# Patient Record
Sex: Female | Born: 1940 | Race: White | Hispanic: No | Marital: Married | Smoking: Never smoker
Health system: Southern US, Community
[De-identification: ages and names within clinical notes are randomized; demographics above are authoritative.]

## PROBLEM LIST (undated history)

## (undated) DIAGNOSIS — M810 Age-related osteoporosis without current pathological fracture: Secondary | ICD-10-CM

## (undated) DIAGNOSIS — J449 Chronic obstructive pulmonary disease, unspecified: Secondary | ICD-10-CM

## (undated) DIAGNOSIS — R0600 Dyspnea, unspecified: Secondary | ICD-10-CM

## (undated) DIAGNOSIS — K760 Fatty (change of) liver, not elsewhere classified: Secondary | ICD-10-CM

## (undated) DIAGNOSIS — K219 Gastro-esophageal reflux disease without esophagitis: Secondary | ICD-10-CM

## (undated) DIAGNOSIS — M1990 Unspecified osteoarthritis, unspecified site: Secondary | ICD-10-CM

## (undated) DIAGNOSIS — I10 Essential (primary) hypertension: Secondary | ICD-10-CM

## (undated) DIAGNOSIS — R42 Dizziness and giddiness: Secondary | ICD-10-CM

## (undated) DIAGNOSIS — M419 Scoliosis, unspecified: Secondary | ICD-10-CM

## (undated) DIAGNOSIS — J45909 Unspecified asthma, uncomplicated: Secondary | ICD-10-CM

## (undated) DIAGNOSIS — M6530 Trigger finger, unspecified finger: Secondary | ICD-10-CM

## (undated) DIAGNOSIS — K222 Esophageal obstruction: Secondary | ICD-10-CM

## (undated) DIAGNOSIS — E559 Vitamin D deficiency, unspecified: Secondary | ICD-10-CM

## (undated) DIAGNOSIS — E669 Obesity, unspecified: Secondary | ICD-10-CM

## (undated) DIAGNOSIS — E785 Hyperlipidemia, unspecified: Secondary | ICD-10-CM

## (undated) HISTORY — PX: UMBILICAL HERNIA REPAIR: SHX196

## (undated) HISTORY — PX: COLON SURGERY: SHX602

## (undated) HISTORY — PX: TUBAL LIGATION: SHX77

## (undated) HISTORY — PX: APPENDECTOMY: SHX54

## (undated) HISTORY — PX: COLONOSCOPY: SHX5424

## (undated) HISTORY — PX: BREAST CYST ASPIRATION: SHX578

## (undated) HISTORY — PX: OTHER SURGICAL HISTORY: SHX169

## (undated) HISTORY — PX: HERNIA REPAIR: SHX51

---

## 2005-01-31 ENCOUNTER — Ambulatory Visit: Payer: Self-pay | Admitting: Internal Medicine

## 2005-07-04 ENCOUNTER — Ambulatory Visit: Payer: Self-pay | Admitting: Gastroenterology

## 2006-02-06 ENCOUNTER — Ambulatory Visit: Payer: Self-pay | Admitting: Internal Medicine

## 2007-02-12 ENCOUNTER — Ambulatory Visit: Payer: Self-pay | Admitting: Internal Medicine

## 2007-07-27 ENCOUNTER — Ambulatory Visit: Payer: Self-pay | Admitting: Gastroenterology

## 2008-02-14 ENCOUNTER — Ambulatory Visit: Payer: Self-pay | Admitting: Internal Medicine

## 2009-02-09 ENCOUNTER — Ambulatory Visit: Payer: Self-pay | Admitting: Unknown Physician Specialty

## 2009-02-16 ENCOUNTER — Ambulatory Visit: Payer: Self-pay | Admitting: Internal Medicine

## 2010-02-18 ENCOUNTER — Ambulatory Visit: Payer: Self-pay | Admitting: Internal Medicine

## 2010-12-20 ENCOUNTER — Ambulatory Visit: Payer: Self-pay | Admitting: Surgery

## 2010-12-24 ENCOUNTER — Ambulatory Visit: Payer: Self-pay | Admitting: Surgery

## 2011-02-21 ENCOUNTER — Ambulatory Visit: Payer: Self-pay | Admitting: Internal Medicine

## 2012-02-22 ENCOUNTER — Ambulatory Visit: Payer: Self-pay | Admitting: Internal Medicine

## 2012-05-23 ENCOUNTER — Ambulatory Visit: Payer: Self-pay | Admitting: Rheumatology

## 2012-05-31 ENCOUNTER — Ambulatory Visit: Payer: Self-pay | Admitting: Rheumatology

## 2013-02-22 ENCOUNTER — Ambulatory Visit: Payer: Self-pay

## 2014-02-24 ENCOUNTER — Ambulatory Visit: Payer: Self-pay

## 2015-02-09 ENCOUNTER — Other Ambulatory Visit: Payer: Self-pay | Admitting: Internal Medicine

## 2015-02-09 DIAGNOSIS — Z1231 Encounter for screening mammogram for malignant neoplasm of breast: Secondary | ICD-10-CM

## 2015-02-26 ENCOUNTER — Ambulatory Visit
Admission: RE | Admit: 2015-02-26 | Discharge: 2015-02-26 | Disposition: A | Discharge status: Home / Self Care | Payer: Medicare Other | Source: Ambulatory Visit | Attending: Internal Medicine | Admitting: Internal Medicine

## 2015-02-26 DIAGNOSIS — Z1231 Encounter for screening mammogram for malignant neoplasm of breast: Secondary | ICD-10-CM | POA: Insufficient documentation

## 2015-07-29 ENCOUNTER — Other Ambulatory Visit: Payer: Self-pay | Admitting: Internal Medicine

## 2015-07-29 DIAGNOSIS — R0989 Other specified symptoms and signs involving the circulatory and respiratory systems: Secondary | ICD-10-CM

## 2015-07-29 DIAGNOSIS — Z1231 Encounter for screening mammogram for malignant neoplasm of breast: Secondary | ICD-10-CM

## 2015-07-30 ENCOUNTER — Encounter: Payer: Self-pay | Admitting: *Deleted

## 2015-07-31 ENCOUNTER — Encounter: Payer: Self-pay | Admitting: *Deleted

## 2015-07-31 ENCOUNTER — Ambulatory Visit
Admission: RE | Admit: 2015-07-31 | Discharge: 2015-07-31 | Disposition: A | Discharge status: Home / Self Care | Payer: Medicare Other | Source: Ambulatory Visit | Attending: Unknown Physician Specialty | Admitting: Unknown Physician Specialty

## 2015-07-31 ENCOUNTER — Ambulatory Visit: Payer: Medicare Other | Admitting: Certified Registered Nurse Anesthetist

## 2015-07-31 ENCOUNTER — Encounter
Admission: RE | Disposition: A | Discharge status: Home / Self Care | Payer: Self-pay | Source: Ambulatory Visit | Attending: Unknown Physician Specialty

## 2015-07-31 DIAGNOSIS — K219 Gastro-esophageal reflux disease without esophagitis: Secondary | ICD-10-CM | POA: Insufficient documentation

## 2015-07-31 DIAGNOSIS — M419 Scoliosis, unspecified: Secondary | ICD-10-CM | POA: Insufficient documentation

## 2015-07-31 DIAGNOSIS — Z79899 Other long term (current) drug therapy: Secondary | ICD-10-CM | POA: Insufficient documentation

## 2015-07-31 DIAGNOSIS — M810 Age-related osteoporosis without current pathological fracture: Secondary | ICD-10-CM | POA: Insufficient documentation

## 2015-07-31 DIAGNOSIS — D122 Benign neoplasm of ascending colon: Secondary | ICD-10-CM | POA: Insufficient documentation

## 2015-07-31 DIAGNOSIS — Z6830 Body mass index (BMI) 30.0-30.9, adult: Secondary | ICD-10-CM | POA: Insufficient documentation

## 2015-07-31 DIAGNOSIS — D124 Benign neoplasm of descending colon: Secondary | ICD-10-CM | POA: Diagnosis not present

## 2015-07-31 DIAGNOSIS — K760 Fatty (change of) liver, not elsewhere classified: Secondary | ICD-10-CM | POA: Diagnosis not present

## 2015-07-31 DIAGNOSIS — I10 Essential (primary) hypertension: Secondary | ICD-10-CM | POA: Insufficient documentation

## 2015-07-31 DIAGNOSIS — Z86010 Personal history of colonic polyps: Secondary | ICD-10-CM | POA: Insufficient documentation

## 2015-07-31 DIAGNOSIS — Z7951 Long term (current) use of inhaled steroids: Secondary | ICD-10-CM | POA: Insufficient documentation

## 2015-07-31 DIAGNOSIS — E785 Hyperlipidemia, unspecified: Secondary | ICD-10-CM | POA: Insufficient documentation

## 2015-07-31 DIAGNOSIS — D125 Benign neoplasm of sigmoid colon: Secondary | ICD-10-CM | POA: Diagnosis not present

## 2015-07-31 DIAGNOSIS — J45909 Unspecified asthma, uncomplicated: Secondary | ICD-10-CM | POA: Diagnosis not present

## 2015-07-31 DIAGNOSIS — K621 Rectal polyp: Secondary | ICD-10-CM | POA: Diagnosis not present

## 2015-07-31 DIAGNOSIS — Z7982 Long term (current) use of aspirin: Secondary | ICD-10-CM | POA: Diagnosis not present

## 2015-07-31 DIAGNOSIS — D120 Benign neoplasm of cecum: Secondary | ICD-10-CM | POA: Insufficient documentation

## 2015-07-31 DIAGNOSIS — E669 Obesity, unspecified: Secondary | ICD-10-CM | POA: Diagnosis not present

## 2015-07-31 DIAGNOSIS — Z803 Family history of malignant neoplasm of breast: Secondary | ICD-10-CM | POA: Insufficient documentation

## 2015-07-31 HISTORY — DX: Unspecified asthma, uncomplicated: J45.909

## 2015-07-31 HISTORY — DX: Obesity, unspecified: E66.9

## 2015-07-31 HISTORY — DX: Esophageal obstruction: K22.2

## 2015-07-31 HISTORY — DX: Essential (primary) hypertension: I10

## 2015-07-31 HISTORY — DX: Gastro-esophageal reflux disease without esophagitis: K21.9

## 2015-07-31 HISTORY — DX: Hyperlipidemia, unspecified: E78.5

## 2015-07-31 HISTORY — PX: COLONOSCOPY WITH PROPOFOL: SHX5780

## 2015-07-31 HISTORY — DX: Age-related osteoporosis without current pathological fracture: M81.0

## 2015-07-31 HISTORY — DX: Fatty (change of) liver, not elsewhere classified: K76.0

## 2015-07-31 HISTORY — DX: Scoliosis, unspecified: M41.9

## 2015-07-31 SURGERY — COLONOSCOPY WITH PROPOFOL
Anesthesia: General

## 2015-07-31 MED ORDER — PHENYLEPHRINE HCL 10 MG/ML IJ SOLN
INTRAMUSCULAR | Status: DC | PRN
  Administered 2015-07-31: 100 ug via INTRAVENOUS

## 2015-07-31 MED ORDER — LIDOCAINE HCL (CARDIAC) 20 MG/ML IV SOLN
INTRAVENOUS | Status: DC | PRN
  Administered 2015-07-31: 80 mg via INTRAVENOUS

## 2015-07-31 MED ORDER — MIDAZOLAM HCL 2 MG/2ML IJ SOLN
INTRAMUSCULAR | Status: DC | PRN
  Administered 2015-07-31: 1 mg via INTRAVENOUS

## 2015-07-31 MED ORDER — SODIUM CHLORIDE 0.9 % IV SOLN: INTRAVENOUS | Status: DC

## 2015-07-31 MED ORDER — PROPOFOL 500 MG/50ML IV EMUL
INTRAVENOUS | Status: DC | PRN
  Administered 2015-07-31: 120 ug/kg/min via INTRAVENOUS

## 2015-07-31 MED ORDER — PROPOFOL 10 MG/ML IV BOLUS
INTRAVENOUS | Status: DC | PRN
  Administered 2015-07-31: 20 mg via INTRAVENOUS
  Administered 2015-07-31: 10 mg via INTRAVENOUS
  Administered 2015-07-31: 20 mg via INTRAVENOUS
  Administered 2015-07-31: 10 mg via INTRAVENOUS

## 2015-07-31 MED ORDER — SODIUM CHLORIDE 0.9 % IV SOLN
INTRAVENOUS | Status: DC
  Administered 2015-07-31: 10:00:00 via INTRAVENOUS

## 2015-07-31 NOTE — Anesthesia Preprocedure Evaluation (Signed)
Anesthesia Evaluation  Patient identified by MRN, date of birth, ID band Patient awake    Reviewed: Allergy & Precautions, H&P , NPO status , Patient's Chart, lab work & pertinent test results  History of Anesthesia Complications Negative for: history of anesthetic complications  Airway Mallampati: III  TM Distance: >3 FB Neck ROM: limited    Dental  (+) Poor Dentition, Chipped   Pulmonary neg shortness of breath, asthma ,    Pulmonary exam normal breath sounds clear to auscultation       Cardiovascular Exercise Tolerance: Good hypertension, (-) angina(-) Past MI and (-) DOE Normal cardiovascular exam Rhythm:regular Rate:Normal     Neuro/Psych negative neurological ROS  negative psych ROS   GI/Hepatic Neg liver ROS, GERD  Controlled,  Endo/Other  negative endocrine ROS  Renal/GU negative Renal ROS  negative genitourinary   Musculoskeletal   Abdominal   Peds  Hematology negative hematology ROS (+)   Anesthesia Other Findings Past Medical History:   Asthma                                                       Obesity                                                      Lipids serum increased                                       Scoliosis                                                    Osteoporosis                                                 Fatty liver                                                  Esophageal stricture                                         GERD (gastroesophageal reflux disease)                       Hypertension                                                Past Surgical History:   COLON SURGERY  ADENTAMOUS POLYP                                              ESOPHAGEAL DILITATION                                         UMBILICAL HERNIA REPAIR                                       HERNIA REPAIR                                                  rt ELBOW SURGERY                                              COLONOSCOPY                                                  BMI    Body Mass Index   30.35 kg/m 2      Reproductive/Obstetrics negative OB ROS                             Anesthesia Physical Anesthesia Plan  ASA: III  Anesthesia Plan: General   Post-op Pain Management:    Induction:   Airway Management Planned:   Additional Equipment:   Intra-op Plan:   Post-operative Plan:   Informed Consent: I have reviewed the patients History and Physical, chart, labs and discussed the procedure including the risks, benefits and alternatives for the proposed anesthesia with the patient or authorized representative who has indicated his/her understanding and acceptance.   Dental Advisory Given  Plan Discussed with: Anesthesiologist, CRNA and Surgeon  Anesthesia Plan Comments:         Anesthesia Quick Evaluation

## 2015-07-31 NOTE — Anesthesia Postprocedure Evaluation (Signed)
Anesthesia Post Note  Patient: Janet Rios  Procedure(s) Performed: Procedure(s) (LRB): COLONOSCOPY WITH PROPOFOL (N/A)  Patient location during evaluation: Endoscopy Anesthesia Type: General Level of consciousness: awake and alert Pain management: pain level controlled Vital Signs Assessment: post-procedure vital signs reviewed and stable Respiratory status: spontaneous breathing, nonlabored ventilation, respiratory function stable and patient connected to nasal cannula oxygen Cardiovascular status: blood pressure returned to baseline and stable Postop Assessment: no signs of nausea or vomiting Anesthetic complications: no    Last Vitals:  Filed Vitals:   07/31/15 1120 07/31/15 1130  BP: 148/64 148/71  Pulse: 60 63  Temp:    Resp: 21 23    Last Pain: There were no vitals filed for this visit.               Precious Haws Raniyah Curenton

## 2015-07-31 NOTE — Transfer of Care (Signed)
Immediate Anesthesia Transfer of Care Note  Patient: Janet Rios  Procedure(s) Performed: Procedure(s): COLONOSCOPY WITH PROPOFOL (N/A)  Patient Location: PACU  Anesthesia Type:General  Level of Consciousness: sedated  Airway & Oxygen Therapy: Patient Spontanous Breathing and Patient connected to nasal cannula oxygen  Post-op Assessment: Report given to RN and Post -op Vital signs reviewed and stable  Post vital signs: Reviewed and stable  Last Vitals:  Filed Vitals:   07/31/15 0916 07/31/15 1059  BP: 162/83 116/46  Pulse: 82 71  Temp: 36.3 C 36.2 C  Resp: 18 22    Complications: No apparent anesthesia complications

## 2015-07-31 NOTE — Op Note (Signed)
[facility] Gastroenterology Patient Name: Janet Rios Procedure Date: 07/31/2015 9:39 AM MRN: [id number] Account #: [id number] Date of Birth: 1941-05-22 Admit Type: Outpatient Age: 74 Room: [facility] ENDO ROOM 4 Gender: Female Note Status: Finalized Procedure:         Colonoscopy Indications:       High risk colon cancer surveillance: Personal history of                     colonic polyps Providers:         Manya Silvas, MD Referring MD:      Rusty Aus, MD (Referring MD) Medicines:         Propofol per Anesthesia Complications:     No immediate complications. Procedure:         Pre-Anesthesia Assessment:                    - After reviewing the risks and benefits, the patient was                     deemed in satisfactory condition to undergo the procedure.                    After obtaining informed consent, the colonoscope was                     passed under direct vision. Throughout the procedure, the                     patient's blood pressure, pulse, and oxygen saturations                     were monitored continuously. The Colonoscope was                     introduced through the anus and advanced to the the cecum,                     identified by appendiceal orifice and ileocecal valve. The                     colonoscopy was somewhat difficult. The patient tolerated                     the procedure well. The quality of the bowel preparation                     was excellent. Findings:      Two sessile polyps were found at the ileocecal valve. The polyps were       diminutive in size. These polyps were removed with a cold biopsy       forceps. Resection and retrieval were complete.      A 20 mm polyp was found in the proximal ascending colon. The polyp was       sessile. The polyp was removed with a saline injection-lift technique       using a hot snare. Resection and retrieval were done in piecemeal       fashion and was complete.  Coagulation for destruction of remaining       portion of lesion using argon plasma at 0.4 liters/minute and 20 watts       was successful.      A diminutive polyp was found in the descending colon. The polyp was  sessile. The polyp was removed with a jumbo cold forceps. Resection and       retrieval were complete.      A small polyp was found in the sigmoid colon. The polyp was sessile. The       polyp was removed with a hot snare. Resection and retrieval were       complete.      A diminutive polyp was found in the rectum. The polyp was sessile. The       polyp was removed with a cold biopsy forceps. Resection and retrieval       were complete. Impression:        - Two diminutive polyps at the ileocecal valve. Resected                     and retrieved.                    - One 20 mm polyp in the proximal ascending colon.                     Resected and retrieved. Treated with argon plasma                     coagulation (APC).                    - One diminutive polyp in the descending colon. Resected                     and retrieved.                    - One small polyp in the sigmoid colon. Resected and                     retrieved.                    - One diminutive polyp in the rectum. Resected and                     retrieved. Recommendation:    - Await pathology results. Avoid seeds, nuts, popcorn for                     2 weeks. Manya Silvas, MD 07/31/2015 11:01:29 AM This report has been signed electronically. Number of Addenda: 0 Note Initiated On: 07/31/2015 9:39 AM Scope Withdrawal Time: 0 hours 55 minutes 2 seconds  Total Procedure Duration: 1 hour 2 minutes 32 seconds       [facility]

## 2015-07-31 NOTE — H&P (Signed)
Primary Care Physician:  Rusty Aus., MD Primary Gastroenterologist:  Dr. Vira Agar  Pre-Procedure History & Physical: HPI:  Janet Rios is a 74 y.o. female is here for an colonoscopy.   Past Medical History  Diagnosis Date  . Asthma   . Obesity   . Lipids serum increased   . Scoliosis   . Osteoporosis   . Fatty liver   . Esophageal stricture   . GERD (gastroesophageal reflux disease)   . Hypertension     Past Surgical History  Procedure Laterality Date  . Colon surgery    . Adentamous polyp    . Esophageal dilitation    . Umbilical hernia repair    . Hernia repair    . Rt elbow surgery    . Colonoscopy      Prior to Admission medications   Medication Sig Start Date End Date Taking? Authorizing Provider  albuterol (PROVENTIL HFA;VENTOLIN HFA) 108 (90 Base) MCG/ACT inhaler Inhale 2 puffs into the lungs every 4 (four) hours as needed for wheezing or shortness of breath.   Yes Historical Provider, MD  aspirin 81 MG tablet Take 81 mg by mouth daily.   Yes Historical Provider, MD  co-enzyme Q-10 30 MG capsule Take 100 mg by mouth 3 (three) times daily.   Yes Historical Provider, MD  esomeprazole (NEXIUM) 40 MG capsule Take 40 mg by mouth daily at 12 noon.   Yes Historical Provider, MD  fluticasone (FLONASE) 50 MCG/ACT nasal spray Place into both nostrils daily.   Yes Historical Provider, MD  folic acid (FOLVITE) A999333 MCG tablet Take 800 mcg by mouth daily.   Yes Historical Provider, MD  levocetirizine (XYZAL) 5 MG tablet Take 5 mg by mouth every evening.   Yes Historical Provider, MD  metoprolol (LOPRESSOR) 50 MG tablet Take 50 mg by mouth 2 (two) times daily.   Yes Historical Provider, MD  montelukast (SINGULAIR) 10 MG tablet Take 10 mg by mouth at bedtime.   Yes Historical Provider, MD  vitamin B-12 (CYANOCOBALAMIN) 250 MCG tablet Take 250 mcg by mouth daily.   Yes Historical Provider, MD  Vitamin D, Ergocalciferol, (DRISDOL) 50000 units CAPS capsule Take 50,000 Units by  mouth every 7 (seven) days.   Yes Historical Provider, MD  pantoprazole (PROTONIX) 40 MG tablet Take 40 mg by mouth daily. Reported on 07/31/2015    Historical Provider, MD    Allergies as of 06/16/2015  . (Not on File)    Family History  Problem Relation Age of Onset  . Breast cancer Maternal Aunt 50    Social History   Social History  . Marital Status: Married    Spouse Name: N/A  . Number of Children: N/A  . Years of Education: N/A   Occupational History  . Not on file.   Social History Main Topics  . Smoking status: Never Smoker   . Smokeless tobacco: Never Used  . Alcohol Use: No  . Drug Use: No  . Sexual Activity: Yes    Birth Control/ Protection: Patch   Other Topics Concern  . Not on file   Social History Narrative    Review of Systems: See HPI, otherwise negative ROS  Physical Exam: BP 162/83 mmHg  Pulse 82  Temp(Src) 97.3 F (36.3 C) (Tympanic)  Resp 18  Ht 5\' 2"  (1.575 m)  Wt 75.297 kg (166 lb)  BMI 30.35 kg/m2  SpO2 98% General:   Alert,  pleasant and cooperative in NAD Head:  Normocephalic and atraumatic.  Neck:  Supple; no masses or thyromegaly. Lungs:  Clear throughout to auscultation.    Heart:  Regular rate and rhythm. Abdomen:  Soft, nontender and nondistended. Normal bowel sounds, without guarding, and without rebound.   Neurologic:  Alert and  oriented x4;  grossly normal neurologically.  Impression/Plan: Janet Rios is here for an colonoscopy to be performed for personal history colon polyps  Risks, benefits, limitations, and alternatives regarding  colonoscopy have been reviewed with the patient.  Questions have been answered.  All parties agreeable.   Gaylyn Cheers, MD  07/31/2015, 9:44 AM

## 2015-08-04 LAB — SURGICAL PATHOLOGY

## 2015-08-05 ENCOUNTER — Ambulatory Visit: Payer: Medicare Other

## 2015-08-05 ENCOUNTER — Encounter: Payer: Self-pay | Admitting: Unknown Physician Specialty

## 2015-08-06 ENCOUNTER — Ambulatory Visit: Payer: Medicare Other

## 2015-08-11 ENCOUNTER — Ambulatory Visit
Admission: RE | Admit: 2015-08-11 | Discharge: 2015-08-11 | Disposition: A | Discharge status: Home / Self Care | Payer: Medicare Other | Source: Ambulatory Visit | Attending: Internal Medicine | Admitting: Internal Medicine

## 2015-08-11 DIAGNOSIS — R0989 Other specified symptoms and signs involving the circulatory and respiratory systems: Secondary | ICD-10-CM | POA: Diagnosis present

## 2015-12-10 ENCOUNTER — Encounter: Payer: Self-pay | Admitting: *Deleted

## 2015-12-11 ENCOUNTER — Encounter: Payer: Self-pay | Admitting: *Deleted

## 2015-12-11 ENCOUNTER — Ambulatory Visit: Payer: Medicare Other | Admitting: Anesthesiology

## 2015-12-11 ENCOUNTER — Ambulatory Visit
Admission: RE | Admit: 2015-12-11 | Discharge: 2015-12-11 | Disposition: A | Discharge status: Home / Self Care | Payer: Medicare Other | Source: Ambulatory Visit | Attending: Unknown Physician Specialty | Admitting: Unknown Physician Specialty

## 2015-12-11 ENCOUNTER — Encounter
Admission: RE | Disposition: A | Discharge status: Home / Self Care | Payer: Self-pay | Source: Ambulatory Visit | Attending: Unknown Physician Specialty

## 2015-12-11 DIAGNOSIS — Z86010 Personal history of colonic polyps: Secondary | ICD-10-CM | POA: Insufficient documentation

## 2015-12-11 DIAGNOSIS — D122 Benign neoplasm of ascending colon: Secondary | ICD-10-CM | POA: Diagnosis not present

## 2015-12-11 DIAGNOSIS — E669 Obesity, unspecified: Secondary | ICD-10-CM | POA: Diagnosis not present

## 2015-12-11 DIAGNOSIS — J310 Chronic rhinitis: Secondary | ICD-10-CM | POA: Diagnosis not present

## 2015-12-11 DIAGNOSIS — Z1211 Encounter for screening for malignant neoplasm of colon: Secondary | ICD-10-CM | POA: Insufficient documentation

## 2015-12-11 DIAGNOSIS — K640 First degree hemorrhoids: Secondary | ICD-10-CM | POA: Insufficient documentation

## 2015-12-11 DIAGNOSIS — D123 Benign neoplasm of transverse colon: Secondary | ICD-10-CM | POA: Insufficient documentation

## 2015-12-11 DIAGNOSIS — Z803 Family history of malignant neoplasm of breast: Secondary | ICD-10-CM | POA: Diagnosis not present

## 2015-12-11 DIAGNOSIS — K760 Fatty (change of) liver, not elsewhere classified: Secondary | ICD-10-CM | POA: Insufficient documentation

## 2015-12-11 DIAGNOSIS — Z7982 Long term (current) use of aspirin: Secondary | ICD-10-CM | POA: Insufficient documentation

## 2015-12-11 DIAGNOSIS — M810 Age-related osteoporosis without current pathological fracture: Secondary | ICD-10-CM | POA: Diagnosis not present

## 2015-12-11 DIAGNOSIS — E785 Hyperlipidemia, unspecified: Secondary | ICD-10-CM | POA: Diagnosis not present

## 2015-12-11 DIAGNOSIS — K219 Gastro-esophageal reflux disease without esophagitis: Secondary | ICD-10-CM | POA: Insufficient documentation

## 2015-12-11 DIAGNOSIS — M419 Scoliosis, unspecified: Secondary | ICD-10-CM | POA: Diagnosis not present

## 2015-12-11 DIAGNOSIS — Z79899 Other long term (current) drug therapy: Secondary | ICD-10-CM | POA: Diagnosis not present

## 2015-12-11 DIAGNOSIS — E559 Vitamin D deficiency, unspecified: Secondary | ICD-10-CM | POA: Insufficient documentation

## 2015-12-11 DIAGNOSIS — K644 Residual hemorrhoidal skin tags: Secondary | ICD-10-CM | POA: Insufficient documentation

## 2015-12-11 DIAGNOSIS — Z7951 Long term (current) use of inhaled steroids: Secondary | ICD-10-CM | POA: Insufficient documentation

## 2015-12-11 DIAGNOSIS — J45909 Unspecified asthma, uncomplicated: Secondary | ICD-10-CM | POA: Insufficient documentation

## 2015-12-11 DIAGNOSIS — Z6830 Body mass index (BMI) 30.0-30.9, adult: Secondary | ICD-10-CM | POA: Diagnosis not present

## 2015-12-11 DIAGNOSIS — Z888 Allergy status to other drugs, medicaments and biological substances status: Secondary | ICD-10-CM | POA: Insufficient documentation

## 2015-12-11 DIAGNOSIS — K641 Second degree hemorrhoids: Secondary | ICD-10-CM | POA: Diagnosis not present

## 2015-12-11 DIAGNOSIS — I10 Essential (primary) hypertension: Secondary | ICD-10-CM | POA: Insufficient documentation

## 2015-12-11 HISTORY — DX: Trigger finger, unspecified finger: M65.30

## 2015-12-11 HISTORY — DX: Vitamin D deficiency, unspecified: E55.9

## 2015-12-11 HISTORY — PX: COLONOSCOPY WITH PROPOFOL: SHX5780

## 2015-12-11 SURGERY — COLONOSCOPY WITH PROPOFOL
Anesthesia: General

## 2015-12-11 MED ORDER — SODIUM CHLORIDE 0.9 % IV SOLN: INTRAVENOUS | Status: DC

## 2015-12-11 MED ORDER — SODIUM CHLORIDE 0.9 % IV SOLN
INTRAVENOUS | Status: DC
Start: 2015-12-11 — End: 2015-12-11
  Administered 2015-12-11: 1000 mL via INTRAVENOUS

## 2015-12-11 MED ORDER — PROPOFOL 10 MG/ML IV BOLUS
INTRAVENOUS | Status: DC | PRN
  Administered 2015-12-11: 80 mg via INTRAVENOUS
  Administered 2015-12-11: 20 mg via INTRAVENOUS

## 2015-12-11 MED ORDER — PROPOFOL 500 MG/50ML IV EMUL
INTRAVENOUS | Status: DC | PRN
  Administered 2015-12-11: 160 ug/kg/min via INTRAVENOUS

## 2015-12-11 MED ORDER — SODIUM CHLORIDE 0.9 % IV SOLN
INTRAVENOUS | Status: DC | PRN
  Administered 2015-12-11: 09:00:00 via INTRAVENOUS

## 2015-12-11 MED ORDER — LIDOCAINE HCL (CARDIAC) 20 MG/ML IV SOLN
INTRAVENOUS | Status: DC | PRN
  Administered 2015-12-11: 30 mg via INTRAVENOUS

## 2015-12-11 MED ORDER — MIDAZOLAM HCL 5 MG/5ML IJ SOLN
INTRAMUSCULAR | Status: DC | PRN
  Administered 2015-12-11: 1 mg via INTRAVENOUS

## 2015-12-11 MED ORDER — FENTANYL CITRATE (PF) 100 MCG/2ML IJ SOLN
INTRAMUSCULAR | Status: DC | PRN
  Administered 2015-12-11: 50 ug via INTRAVENOUS

## 2015-12-11 NOTE — Op Note (Signed)
[facility] Gastroenterology Patient Name: Janet Rios Procedure Date: 12/11/2015 9:36 AM MRN: [id number] Account #: [id number] Date of Birth: [birth year] Admit Type: Outpatient Age: 75 Room: [facility] ENDO ROOM 1 Gender: Female Note Status: Finalized Procedure:            Colonoscopy Indications:          High risk colon cancer surveillance: Personal history                        of colonic polyps Providers:            Manya Silvas, MD Referring MD:         Rusty Aus, MD (Referring MD) Medicines:            Propofol per Anesthesia Complications:        No immediate complications. Procedure:            Pre-Anesthesia Assessment:                       - After reviewing the risks and benefits, the patient                        was deemed in satisfactory condition to undergo the                        procedure.                       After obtaining informed consent, the colonoscope was                        passed under direct vision. Throughout the procedure,                        the patient's blood pressure, pulse, and oxygen                        saturations were monitored continuously. The                        Colonoscope was introduced through the anus and                        advanced to the the cecum, identified by appendiceal                        orifice and ileocecal valve. The colonoscopy was                        performed without difficulty. The patient tolerated the                        procedure well. The quality of the bowel preparation                        was excellent. Findings:      A diminutive polyp was found in the proximal ascending colon. The polyp       was sessile. The polyp was removed with a jumbo cold forceps. Resection       and retrieval were complete.      A diminutive polyp  was found in the ascending colon. The polyp was       sessile. The polyp was removed with a jumbo cold forceps. Resection and   retrieval were complete.      A small polyp was found in the proximal transverse colon. The polyp was       sessile. The polyp was removed with a hot snare. Resection and retrieval       were complete. One clip applied to the site      A diminutive polyp was found in the transverse colon. The polyp was       sessile. The polyp was removed with a cold snare. Resection and       retrieval were complete.      External and internal hemorrhoids were found during endoscopy. The       hemorrhoids were small, medium-sized, Grade I (internal hemorrhoids that       do not prolapse) and Grade II (internal hemorrhoids that prolapse but       reduce spontaneously). Impression:           - One diminutive polyp in the proximal ascending colon,                        removed with a jumbo cold forceps. Resected and                        retrieved.                       - One diminutive polyp in the ascending colon, removed                        with a jumbo cold forceps. Resected and retrieved.                       - One small polyp in the proximal transverse colon,                        removed with a hot snare. Resected and retrieved.                       - One diminutive polyp in the transverse colon, removed                        with a cold snare. Resected and retrieved.                       - External and internal hemorrhoids. Recommendation:       - Await pathology results. Manya Silvas, MD 12/11/2015 10:16:39 AM This report has been signed electronically. Number of Addenda: 0 Note Initiated On: 12/11/2015 9:36 AM Scope Withdrawal Time: 0 hours 19 minutes 44 seconds  Total Procedure Duration: 0 hours 27 minutes 35 seconds       [facility]

## 2015-12-11 NOTE — Anesthesia Postprocedure Evaluation (Signed)
Anesthesia Post Note  Patient: Janet Rios  Procedure(s) Performed: Procedure(s) (LRB): COLONOSCOPY WITH PROPOFOL (N/A)  Patient location during evaluation: Endoscopy Anesthesia Type: General Level of consciousness: awake and alert Pain management: pain level controlled Vital Signs Assessment: post-procedure vital signs reviewed and stable Respiratory status: spontaneous breathing, nonlabored ventilation, respiratory function stable and patient connected to nasal cannula oxygen Cardiovascular status: blood pressure returned to baseline and stable Postop Assessment: no signs of nausea or vomiting Anesthetic complications: no    Last Vitals:  Filed Vitals:   12/11/15 1030 12/11/15 1040  BP: 143/74 150/65  Pulse: 59 57  Temp:    Resp: 18 16    Last Pain: There were no vitals filed for this visit.               Martha Clan

## 2015-12-11 NOTE — Transfer of Care (Signed)
Immediate Anesthesia Transfer of Care Note  Patient: Janet Rios  Procedure(s) Performed: Procedure(s): COLONOSCOPY WITH PROPOFOL (N/A)  Patient Location: PACU and Endoscopy Unit  Anesthesia Type:General  Level of Consciousness: sedated  Airway & Oxygen Therapy: Patient Spontanous Breathing and Patient connected to nasal cannula oxygen  Post-op Assessment: Report given to RN and Post -op Vital signs reviewed and stable  Post vital signs: Reviewed and stable  Last Vitals:  Filed Vitals:   12/11/15 0907  BP: 164/75  Pulse: 71  Temp: 36.3 C  Resp: 18    Last Pain: There were no vitals filed for this visit.       Complications: No apparent anesthesia complications

## 2015-12-11 NOTE — H&P (Signed)
Primary Care Physician:  Rusty Aus, MD Primary Gastroenterologist:  Dr. Vira Agar  Pre-Procedure History & Physical: HPI:  Janet Rios is a 75 y.o. female is here for an colonoscopy.   Past Medical History  Diagnosis Date  . Obesity   . Lipids serum increased   . Scoliosis   . Osteoporosis   . Fatty liver   . Esophageal stricture   . GERD (gastroesophageal reflux disease)   . Hypertension   . Asthma     Ashtma without status asthmaticus  . Trigger finger, acquired   . Vitamin D deficiency     Past Surgical History  Procedure Laterality Date  . Adentamous polyp    . Esophageal dilitation    . Umbilical hernia repair    . Rt elbow surgery    . Colonoscopy    . Colonoscopy with propofol N/A 07/31/2015    Procedure: COLONOSCOPY WITH PROPOFOL;  Surgeon: Manya Silvas, MD;  Location: [facility] ENDOSCOPY;  Service: Endoscopy;  Laterality: N/A;  . Hernia repair      umbilical  . Colon surgery    . Appendectomy    . Tubal ligation      Prior to Admission medications   Medication Sig Start Date End Date Taking? Authorizing Provider  aspirin 81 MG tablet Take 81 mg by mouth daily.   Yes Historical Provider, MD  Cholecalciferol (VITAMIN D3) 2000 units capsule Take 2,000 Units by mouth daily.   Yes Historical Provider, MD  levocetirizine (XYZAL) 5 MG tablet Take 5 mg by mouth every evening.   Yes Historical Provider, MD  metoprolol (LOPRESSOR) 50 MG tablet Take 50 mg by mouth 2 (two) times daily.   Yes Historical Provider, MD  montelukast (SINGULAIR) 10 MG tablet Take 10 mg by mouth at bedtime.   Yes Historical Provider, MD  albuterol (PROVENTIL HFA;VENTOLIN HFA) 108 (90 Base) MCG/ACT inhaler Inhale 2 puffs into the lungs every 4 (four) hours as needed for wheezing or shortness of breath.    Historical Provider, MD  co-enzyme Q-10 30 MG capsule Take 100 mg by mouth 3 (three) times daily.    Historical Provider, MD  esomeprazole (NEXIUM) 40 MG capsule Take 40 mg by mouth daily  at 12 noon.    Historical Provider, MD  fluticasone (FLONASE) 50 MCG/ACT nasal spray Place into both nostrils daily.    Historical Provider, MD  folic acid (FOLVITE) A999333 MCG tablet Take 800 mcg by mouth daily.    Historical Provider, MD  pantoprazole (PROTONIX) 40 MG tablet Take 40 mg by mouth daily. Reported on 07/31/2015    Historical Provider, MD  vitamin B-12 (CYANOCOBALAMIN) 250 MCG tablet Take 250 mcg by mouth daily.    Historical Provider, MD  Vitamin D, Ergocalciferol, (DRISDOL) 50000 units CAPS capsule Take 50,000 Units by mouth every 7 (seven) days.    Historical Provider, MD    Allergies as of 12/01/2015 - Review Complete 07/31/2015  Allergen Reaction Noted  . Alendronate  07/30/2015    Family History  Problem Relation Age of Onset  . Breast cancer Maternal Aunt 50    Social History   Social History  . Marital Status: Married    Spouse Name: N/A  . Number of Children: N/A  . Years of Education: N/A   Occupational History  . Not on file.   Social History Main Topics  . Smoking status: Never Smoker   . Smokeless tobacco: Never Used  . Alcohol Use: No  . Drug Use:  No  . Sexual Activity: Yes    Birth Control/ Protection: Patch   Other Topics Concern  . Not on file   Social History Narrative    Review of Systems: See HPI, otherwise negative ROS  Physical Exam: BP 164/75 mmHg  Pulse 71  Temp(Src) 97.4 F (36.3 C) (Tympanic)  Resp 18  Ht 5\' 1"  (1.549 m)  Wt 72.576 kg (160 lb)  BMI 30.25 kg/m2  SpO2 97% General:   Alert,  pleasant and cooperative in NAD Head:  Normocephalic and atraumatic. Neck:  Supple; no masses or thyromegaly. Lungs:  Clear throughout to auscultation.    Heart:  Regular rate and rhythm. Abdomen:  Soft, nontender and nondistended. Normal bowel sounds, without guarding, and without rebound.   Neurologic:  Alert and  oriented x4;  grossly normal neurologically.  Impression/Plan: [name] is here for an colonoscopy to be  performed for [facility] large polyp removed piecemeal on previous colonoscopy  Risks, benefits, limitations, and alternatives regarding  colonoscopy have been reviewed with the patient.  Questions have been answered.  All parties agreeable.   Gaylyn Cheers, MD  12/11/2015, 9:36 AM

## 2015-12-11 NOTE — Anesthesia Preprocedure Evaluation (Signed)
Anesthesia Evaluation  Patient identified by MRN, date of birth, ID band Patient awake    Reviewed: Allergy & Precautions, H&P , NPO status , Patient's Chart, lab work & pertinent test results, reviewed documented beta blocker date and time   History of Anesthesia Complications Negative for: history of anesthetic complications  Airway Mallampati: II  TM Distance: >3 FB Neck ROM: full    Dental  (+) Partial Upper, Partial Lower   Pulmonary neg shortness of breath, asthma , neg sleep apnea, neg COPD, neg recent URI,    Pulmonary exam normal breath sounds clear to auscultation       Cardiovascular Exercise Tolerance: Good hypertension, (-) angina(-) CAD, (-) Past MI, (-) Cardiac Stents and (-) CABG Normal cardiovascular exam(-) dysrhythmias (-) Valvular Problems/Murmurs Rhythm:regular Rate:Normal     Neuro/Psych negative neurological ROS  negative psych ROS   GI/Hepatic GERD  ,NAFLD   Endo/Other  negative endocrine ROS  Renal/GU negative Renal ROS  negative genitourinary   Musculoskeletal   Abdominal   Peds  Hematology negative hematology ROS (+)   Anesthesia Other Findings Past Medical History:   Obesity                                                      Lipids serum increased                                       Scoliosis                                                    Osteoporosis                                                 Fatty liver                                                  Esophageal stricture                                         GERD (gastroesophageal reflux disease)                       Hypertension                                                 Asthma  Comment:Ashtma without status asthmaticus   Trigger finger, acquired                                     Vitamin D deficiency                                         Reproductive/Obstetrics negative OB ROS                             Anesthesia Physical Anesthesia Plan  ASA: III  Anesthesia Plan: General   Post-op Pain Management:    Induction:   Airway Management Planned:   Additional Equipment:   Intra-op Plan:   Post-operative Plan:   Informed Consent: I have reviewed the patients History and Physical, chart, labs and discussed the procedure including the risks, benefits and alternatives for the proposed anesthesia with the patient or authorized representative who has indicated his/her understanding and acceptance.   Dental Advisory Given  Plan Discussed with: Anesthesiologist, CRNA and Surgeon  Anesthesia Plan Comments:         Anesthesia Quick Evaluation

## 2015-12-14 ENCOUNTER — Encounter: Payer: Self-pay | Admitting: Unknown Physician Specialty

## 2015-12-14 LAB — SURGICAL PATHOLOGY

## 2016-02-29 ENCOUNTER — Other Ambulatory Visit: Payer: Self-pay | Admitting: Internal Medicine

## 2016-02-29 ENCOUNTER — Ambulatory Visit
Admission: RE | Admit: 2016-02-29 | Discharge: 2016-02-29 | Disposition: A | Discharge status: Home / Self Care | Payer: Medicare Other | Source: Ambulatory Visit | Attending: Internal Medicine | Admitting: Internal Medicine

## 2016-02-29 DIAGNOSIS — Z1231 Encounter for screening mammogram for malignant neoplasm of breast: Secondary | ICD-10-CM

## 2016-08-02 DIAGNOSIS — E7849 Other hyperlipidemia: Secondary | ICD-10-CM | POA: Insufficient documentation

## 2016-09-19 DIAGNOSIS — J449 Chronic obstructive pulmonary disease, unspecified: Secondary | ICD-10-CM | POA: Diagnosis not present

## 2016-09-19 DIAGNOSIS — R0602 Shortness of breath: Secondary | ICD-10-CM | POA: Diagnosis not present

## 2016-10-12 DIAGNOSIS — D225 Melanocytic nevi of trunk: Secondary | ICD-10-CM | POA: Diagnosis not present

## 2016-10-12 DIAGNOSIS — L821 Other seborrheic keratosis: Secondary | ICD-10-CM | POA: Diagnosis not present

## 2016-10-12 DIAGNOSIS — D2271 Melanocytic nevi of right lower limb, including hip: Secondary | ICD-10-CM | POA: Diagnosis not present

## 2016-10-12 DIAGNOSIS — D2262 Melanocytic nevi of left upper limb, including shoulder: Secondary | ICD-10-CM | POA: Diagnosis not present

## 2016-12-14 ENCOUNTER — Other Ambulatory Visit: Payer: Self-pay | Admitting: Internal Medicine

## 2016-12-14 DIAGNOSIS — Z1231 Encounter for screening mammogram for malignant neoplasm of breast: Secondary | ICD-10-CM

## 2016-12-19 DIAGNOSIS — G5601 Carpal tunnel syndrome, right upper limb: Secondary | ICD-10-CM | POA: Diagnosis not present

## 2017-02-16 DIAGNOSIS — R0609 Other forms of dyspnea: Secondary | ICD-10-CM | POA: Diagnosis not present

## 2017-02-16 DIAGNOSIS — J449 Chronic obstructive pulmonary disease, unspecified: Secondary | ICD-10-CM | POA: Diagnosis not present

## 2017-02-16 DIAGNOSIS — R030 Elevated blood-pressure reading, without diagnosis of hypertension: Secondary | ICD-10-CM | POA: Diagnosis not present

## 2017-03-01 ENCOUNTER — Ambulatory Visit
Admission: RE | Admit: 2017-03-01 | Discharge: 2017-03-01 | Disposition: A | Discharge status: Home / Self Care | Payer: Medicare Other | Source: Ambulatory Visit | Attending: Internal Medicine | Admitting: Internal Medicine

## 2017-03-01 DIAGNOSIS — Z1231 Encounter for screening mammogram for malignant neoplasm of breast: Secondary | ICD-10-CM | POA: Diagnosis not present

## 2017-03-01 DIAGNOSIS — Z79899 Other long term (current) drug therapy: Secondary | ICD-10-CM | POA: Diagnosis not present

## 2017-05-10 DIAGNOSIS — Z23 Encounter for immunization: Secondary | ICD-10-CM | POA: Diagnosis not present

## 2017-05-18 DIAGNOSIS — Z23 Encounter for immunization: Secondary | ICD-10-CM | POA: Diagnosis not present

## 2017-05-25 DIAGNOSIS — J4530 Mild persistent asthma, uncomplicated: Secondary | ICD-10-CM | POA: Diagnosis not present

## 2017-05-25 DIAGNOSIS — R05 Cough: Secondary | ICD-10-CM | POA: Diagnosis not present

## 2017-05-25 DIAGNOSIS — R0609 Other forms of dyspnea: Secondary | ICD-10-CM | POA: Diagnosis not present

## 2017-05-25 DIAGNOSIS — R509 Fever, unspecified: Secondary | ICD-10-CM | POA: Diagnosis not present

## 2017-05-27 DIAGNOSIS — J441 Chronic obstructive pulmonary disease with (acute) exacerbation: Secondary | ICD-10-CM | POA: Diagnosis not present

## 2017-07-19 DIAGNOSIS — R0602 Shortness of breath: Secondary | ICD-10-CM | POA: Diagnosis not present

## 2017-07-19 DIAGNOSIS — J4530 Mild persistent asthma, uncomplicated: Secondary | ICD-10-CM | POA: Diagnosis not present

## 2017-07-28 DIAGNOSIS — Z0000 Encounter for general adult medical examination without abnormal findings: Secondary | ICD-10-CM | POA: Diagnosis not present

## 2017-07-28 DIAGNOSIS — R7989 Other specified abnormal findings of blood chemistry: Secondary | ICD-10-CM | POA: Diagnosis not present

## 2017-07-28 DIAGNOSIS — J4530 Mild persistent asthma, uncomplicated: Secondary | ICD-10-CM | POA: Diagnosis not present

## 2017-07-28 DIAGNOSIS — J069 Acute upper respiratory infection, unspecified: Secondary | ICD-10-CM | POA: Diagnosis not present

## 2017-08-04 DIAGNOSIS — Z0000 Encounter for general adult medical examination without abnormal findings: Secondary | ICD-10-CM | POA: Diagnosis not present

## 2017-08-04 DIAGNOSIS — R7989 Other specified abnormal findings of blood chemistry: Secondary | ICD-10-CM | POA: Diagnosis not present

## 2017-08-04 DIAGNOSIS — E7849 Other hyperlipidemia: Secondary | ICD-10-CM | POA: Diagnosis not present

## 2017-08-04 DIAGNOSIS — M818 Other osteoporosis without current pathological fracture: Secondary | ICD-10-CM | POA: Diagnosis not present

## 2017-08-11 DIAGNOSIS — H2513 Age-related nuclear cataract, bilateral: Secondary | ICD-10-CM | POA: Diagnosis not present

## 2017-09-28 DIAGNOSIS — H93A3 Pulsatile tinnitus, bilateral: Secondary | ICD-10-CM | POA: Diagnosis not present

## 2017-09-28 DIAGNOSIS — H9201 Otalgia, right ear: Secondary | ICD-10-CM | POA: Diagnosis not present

## 2017-10-05 DIAGNOSIS — M542 Cervicalgia: Secondary | ICD-10-CM | POA: Diagnosis not present

## 2017-10-05 DIAGNOSIS — I6523 Occlusion and stenosis of bilateral carotid arteries: Secondary | ICD-10-CM | POA: Diagnosis not present

## 2017-10-11 DIAGNOSIS — D2262 Melanocytic nevi of left upper limb, including shoulder: Secondary | ICD-10-CM | POA: Diagnosis not present

## 2017-10-11 DIAGNOSIS — D2272 Melanocytic nevi of left lower limb, including hip: Secondary | ICD-10-CM | POA: Diagnosis not present

## 2017-10-11 DIAGNOSIS — D225 Melanocytic nevi of trunk: Secondary | ICD-10-CM | POA: Diagnosis not present

## 2017-10-11 DIAGNOSIS — L821 Other seborrheic keratosis: Secondary | ICD-10-CM | POA: Diagnosis not present

## 2017-10-17 DIAGNOSIS — H2511 Age-related nuclear cataract, right eye: Secondary | ICD-10-CM | POA: Diagnosis not present

## 2017-10-18 DIAGNOSIS — I6523 Occlusion and stenosis of bilateral carotid arteries: Secondary | ICD-10-CM | POA: Diagnosis not present

## 2017-10-23 DIAGNOSIS — H93A3 Pulsatile tinnitus, bilateral: Secondary | ICD-10-CM | POA: Diagnosis not present

## 2017-10-24 ENCOUNTER — Other Ambulatory Visit: Payer: Self-pay

## 2017-10-24 ENCOUNTER — Encounter: Payer: Self-pay | Admitting: *Deleted

## 2017-10-26 NOTE — Discharge Instructions (Signed)

## 2017-11-01 ENCOUNTER — Ambulatory Visit: Payer: Medicare Other | Admitting: Anesthesiology

## 2017-11-01 ENCOUNTER — Encounter
Admission: RE | Disposition: A | Discharge status: Home / Self Care | Payer: Self-pay | Source: Ambulatory Visit | Attending: Ophthalmology

## 2017-11-01 ENCOUNTER — Ambulatory Visit
Admission: RE | Admit: 2017-11-01 | Discharge: 2017-11-01 | Disposition: A | Discharge status: Home / Self Care | Payer: Medicare Other | Source: Ambulatory Visit | Attending: Ophthalmology | Admitting: Ophthalmology

## 2017-11-01 DIAGNOSIS — H2511 Age-related nuclear cataract, right eye: Secondary | ICD-10-CM | POA: Insufficient documentation

## 2017-11-01 DIAGNOSIS — M419 Scoliosis, unspecified: Secondary | ICD-10-CM | POA: Insufficient documentation

## 2017-11-01 DIAGNOSIS — K219 Gastro-esophageal reflux disease without esophagitis: Secondary | ICD-10-CM | POA: Insufficient documentation

## 2017-11-01 DIAGNOSIS — H25811 Combined forms of age-related cataract, right eye: Secondary | ICD-10-CM | POA: Diagnosis not present

## 2017-11-01 DIAGNOSIS — G8929 Other chronic pain: Secondary | ICD-10-CM | POA: Diagnosis not present

## 2017-11-01 DIAGNOSIS — R0601 Orthopnea: Secondary | ICD-10-CM | POA: Insufficient documentation

## 2017-11-01 DIAGNOSIS — Z880 Allergy status to penicillin: Secondary | ICD-10-CM | POA: Diagnosis not present

## 2017-11-01 DIAGNOSIS — M810 Age-related osteoporosis without current pathological fracture: Secondary | ICD-10-CM | POA: Insufficient documentation

## 2017-11-01 DIAGNOSIS — J45909 Unspecified asthma, uncomplicated: Secondary | ICD-10-CM | POA: Diagnosis not present

## 2017-11-01 DIAGNOSIS — I10 Essential (primary) hypertension: Secondary | ICD-10-CM | POA: Diagnosis not present

## 2017-11-01 DIAGNOSIS — E7800 Pure hypercholesterolemia, unspecified: Secondary | ICD-10-CM | POA: Diagnosis not present

## 2017-11-01 HISTORY — PX: CATARACT EXTRACTION W/PHACO: SHX586

## 2017-11-01 HISTORY — DX: Dizziness and giddiness: R42

## 2017-11-01 SURGERY — PHACOEMULSIFICATION, CATARACT, WITH IOL INSERTION
Anesthesia: Monitor Anesthesia Care | Site: Eye | Laterality: Right | Wound class: "Clean "

## 2017-11-01 MED ORDER — LIDOCAINE HCL (PF) 2 % IJ SOLN
INTRAOCULAR | Status: DC | PRN
  Administered 2017-11-01: 1 mL

## 2017-11-01 MED ORDER — BRIMONIDINE TARTRATE-TIMOLOL 0.2-0.5 % OP SOLN
OPHTHALMIC | Status: DC | PRN
  Administered 2017-11-01: 1 [drp] via OPHTHALMIC

## 2017-11-01 MED ORDER — LACTATED RINGERS IV SOLN: INTRAVENOUS | Status: DC

## 2017-11-01 MED ORDER — MOXIFLOXACIN HCL 0.5 % OP SOLN
1.0000 [drp] | OPHTHALMIC | Status: DC | PRN
  Administered 2017-11-01 (×3): 1 [drp] via OPHTHALMIC

## 2017-11-01 MED ORDER — NA HYALUR & NA CHOND-NA HYALUR 0.4-0.35 ML IO KIT
PACK | INTRAOCULAR | Status: DC | PRN
  Administered 2017-11-01: 1 mL via INTRAOCULAR

## 2017-11-01 MED ORDER — EPINEPHRINE PF 1 MG/ML IJ SOLN
INTRAOCULAR | Status: DC | PRN
  Administered 2017-11-01: 67 mL via OPHTHALMIC

## 2017-11-01 MED ORDER — OXYCODONE HCL 5 MG/5ML PO SOLN: 5.0000 mg | Freq: Once | ORAL | Status: DC | PRN

## 2017-11-01 MED ORDER — FENTANYL CITRATE (PF) 100 MCG/2ML IJ SOLN
INTRAMUSCULAR | Status: DC | PRN
  Administered 2017-11-01: 50 ug via INTRAVENOUS

## 2017-11-01 MED ORDER — ARMC OPHTHALMIC DILATING DROPS
1.0000 "application " | OPHTHALMIC | Status: DC | PRN
  Administered 2017-11-01 (×3): 1 via OPHTHALMIC

## 2017-11-01 MED ORDER — CEFUROXIME OPHTHALMIC INJECTION 1 MG/0.1 ML
INJECTION | OPHTHALMIC | Status: DC | PRN
  Administered 2017-11-01: 0.1 mL via INTRACAMERAL

## 2017-11-01 MED ORDER — MIDAZOLAM HCL 2 MG/2ML IJ SOLN
INTRAMUSCULAR | Status: DC | PRN
  Administered 2017-11-01: 1.5 mg via INTRAVENOUS

## 2017-11-01 MED ORDER — OXYCODONE HCL 5 MG PO TABS: 5.0000 mg | ORAL_TABLET | Freq: Once | ORAL | Status: DC | PRN

## 2017-11-01 SURGICAL SUPPLY — 20 items
CANNULA ANT/CHMB 27G (MISCELLANEOUS) ×1 IMPLANT
CANNULA ANT/CHMB 27GA (MISCELLANEOUS) ×3 IMPLANT
GLOVE SURG LX 7.5 STRW (GLOVE) ×2
GLOVE SURG LX STRL 7.5 STRW (GLOVE) ×1 IMPLANT
GLOVE SURG TRIUMPH 8.0 PF LTX (GLOVE) ×3 IMPLANT
GOWN STRL REUS W/ TWL LRG LVL3 (GOWN DISPOSABLE) ×2 IMPLANT
GOWN STRL REUS W/TWL LRG LVL3 (GOWN DISPOSABLE) ×4
LENS IOL TECNIS ITEC 21.0 (Intraocular Lens) ×2 IMPLANT
MARKER SKIN DUAL TIP RULER LAB (MISCELLANEOUS) ×3 IMPLANT
NDL FILTER BLUNT 18X1 1/2 (NEEDLE) ×1 IMPLANT
NEEDLE FILTER BLUNT 18X 1/2SAF (NEEDLE) ×2
NEEDLE FILTER BLUNT 18X1 1/2 (NEEDLE) ×1 IMPLANT
PACK CATARACT BRASINGTON (MISCELLANEOUS) ×3 IMPLANT
PACK EYE AFTER SURG (MISCELLANEOUS) ×3 IMPLANT
PACK OPTHALMIC (MISCELLANEOUS) ×3 IMPLANT
SYR 3ML LL SCALE MARK (SYRINGE) ×3 IMPLANT
SYR 5ML LL (SYRINGE) ×3 IMPLANT
SYR TB 1ML LUER SLIP (SYRINGE) ×3 IMPLANT
WATER STERILE IRR 500ML POUR (IV SOLUTION) ×3 IMPLANT
WIPE NON LINTING 3.25X3.25 (MISCELLANEOUS) ×3 IMPLANT

## 2017-11-01 NOTE — Anesthesia Postprocedure Evaluation (Signed)
Anesthesia Post Note  Patient: Janet Rios  Procedure(s) Performed: CATARACT EXTRACTION PHACO AND INTRAOCULAR LENS PLACEMENT (IOC) RIGHT (Right Eye)  Patient location during evaluation: PACU Anesthesia Type: MAC Level of consciousness: awake and alert Pain management: pain level controlled Vital Signs Assessment: post-procedure vital signs reviewed and stable Respiratory status: spontaneous breathing, nonlabored ventilation, respiratory function stable and patient connected to nasal cannula oxygen Cardiovascular status: stable and blood pressure returned to baseline Postop Assessment: no apparent nausea or vomiting Anesthetic complications: no    Valta Dillon

## 2017-11-01 NOTE — Anesthesia Preprocedure Evaluation (Signed)
Anesthesia Evaluation  Patient identified by MRN, date of birth, ID band  Reviewed: NPO status   History of Anesthesia Complications Negative for: history of anesthetic complications  Airway Mallampati: II  TM Distance: >3 FB Neck ROM: full    Dental no notable dental hx.    Pulmonary asthma ,    Pulmonary exam normal        Cardiovascular Exercise Tolerance: Good hypertension, Normal cardiovascular exam     Neuro/Psych negative neurological ROS  negative psych ROS   GI/Hepatic GERD  Controlled,Fatty liver   Endo/Other  negative endocrine ROS  Renal/GU negative Renal ROS  negative genitourinary   Musculoskeletal Scoliosis mild   Abdominal   Peds  Hematology negative hematology ROS (+)   Anesthesia Other Findings   Reproductive/Obstetrics                             Anesthesia Physical Anesthesia Plan  ASA: II  Anesthesia Plan: MAC   Post-op Pain Management:    Induction:   PONV Risk Score and Plan:   Airway Management Planned:   Additional Equipment:   Intra-op Plan:   Post-operative Plan:   Informed Consent: I have reviewed the patients History and Physical, chart, labs and discussed the procedure including the risks, benefits and alternatives for the proposed anesthesia with the patient or authorized representative who has indicated his/her understanding and acceptance.     Plan Discussed with: CRNA  Anesthesia Plan Comments:         Anesthesia Quick Evaluation

## 2017-11-01 NOTE — Anesthesia Procedure Notes (Signed)
Procedure Name: MAC Performed by: Galan Ghee, CRNA Pre-anesthesia Checklist: Patient identified, Emergency Drugs available, Suction available, Timeout performed and Patient being monitored Patient Re-evaluated:Patient Re-evaluated prior to induction Oxygen Delivery Method: Nasal cannula Placement Confirmation: positive ETCO2       

## 2017-11-01 NOTE — H&P (Signed)
The History and Physical notes are on paper, have been signed, and are to be scanned. The patient remains stable and unchanged from the H&P.   Previous H&P reviewed, patient examined, and there are no changes.  Janet Rios 11/01/2017 7:37 AM

## 2017-11-01 NOTE — Transfer of Care (Signed)
Immediate Anesthesia Transfer of Care Note  Patient: Janet Rios  Procedure(s) Performed: CATARACT EXTRACTION PHACO AND INTRAOCULAR LENS PLACEMENT (IOC) RIGHT (Right Eye)  Patient Location: PACU  Anesthesia Type: MAC  Level of Consciousness: awake, alert  and patient cooperative  Airway and Oxygen Therapy: Patient Spontanous Breathing and Patient connected to supplemental oxygen  Post-op Assessment: Post-op Vital signs reviewed, Patient's Cardiovascular Status Stable, Respiratory Function Stable, Patent Airway and No signs of Nausea or vomiting  Post-op Vital Signs: Reviewed and stable  Complications: No apparent anesthesia complications

## 2017-11-01 NOTE — Op Note (Signed)
LOCATION:  [place]   PREOPERATIVE DIAGNOSIS:    Nuclear sclerotic cataract right eye. H25.11   POSTOPERATIVE DIAGNOSIS:  Nuclear sclerotic cataract right eye.     PROCEDURE:  Phacoemusification with posterior chamber intraocular lens placement of the right eye   LENS:   Implant Name Type Inv. Item Serial No. Manufacturer Lot No. LRB No. Used  LENS IOL DIOP 21.0 - [id number] Intraocular Lens LENS IOL DIOP 21.0 [id number]  Right 1        ULTRASOUND TIME: 19 % of 1 minutes, 29 seconds.  CDE 16.6   SURGEON:  Wyonia Hough, MD   ANESTHESIA:  Topical with tetracaine drops and 2% Xylocaine jelly, augmented with 1% preservative-free intracameral lidocaine.    COMPLICATIONS:  None.   DESCRIPTION OF PROCEDURE:  The patient was identified in the holding room and transported to the operating room and placed in the supine position under the operating microscope.  The right eye was identified as the operative eye and it was prepped and draped in the usual sterile ophthalmic fashion.   A 1 millimeter clear-corneal paracentesis was made at the 12:00 position.  0.5 ml of preservative-free 1% lidocaine was injected into the anterior chamber. The anterior chamber was filled with Viscoat viscoelastic.  A 2.4 millimeter keratome was used to make a near-clear corneal incision at the 9:00 position.  A curvilinear capsulorrhexis was made with a cystotome and capsulorrhexis forceps.  Balanced salt solution was used to hydrodissect and hydrodelineate the nucleus.   Phacoemulsification was then used in stop and chop fashion to remove the lens nucleus and epinucleus.  The remaining cortex was then removed using the irrigation and aspiration handpiece. Provisc was then placed into the capsular bag to distend it for lens placement.  A lens was then injected into the capsular bag.  The remaining viscoelastic was aspirated.   Wounds were hydrated with balanced salt solution.  The anterior  chamber was inflated to a physiologic pressure with balanced salt solution.  No wound leaks were noted. Cefuroxime 0.1 ml of a 10mg /ml solution was injected into the anterior chamber for a dose of 1 mg of intracameral antibiotic at the completion of the case.   Timolol and Brimonidine drops were applied to the eye.  The patient was taken to the recovery room in stable condition without complications of anesthesia or surgery.   Ricci Paff 11/01/2017, 8:31 AM

## 2017-11-02 ENCOUNTER — Encounter: Payer: Self-pay | Admitting: Ophthalmology

## 2017-12-06 ENCOUNTER — Other Ambulatory Visit: Payer: Self-pay | Admitting: Internal Medicine

## 2017-12-06 DIAGNOSIS — Z1231 Encounter for screening mammogram for malignant neoplasm of breast: Secondary | ICD-10-CM

## 2017-12-29 DIAGNOSIS — H2512 Age-related nuclear cataract, left eye: Secondary | ICD-10-CM | POA: Diagnosis not present

## 2018-01-05 NOTE — Discharge Instructions (Signed)

## 2018-01-10 ENCOUNTER — Ambulatory Visit
Admission: RE | Admit: 2018-01-10 | Discharge: 2018-01-10 | Disposition: A | Discharge status: Home / Self Care | Payer: Medicare Other | Source: Ambulatory Visit | Attending: Ophthalmology | Admitting: Ophthalmology

## 2018-01-10 ENCOUNTER — Encounter
Admission: RE | Disposition: A | Discharge status: Home / Self Care | Payer: Self-pay | Source: Ambulatory Visit | Attending: Ophthalmology

## 2018-01-10 ENCOUNTER — Ambulatory Visit: Payer: Medicare Other | Admitting: Anesthesiology

## 2018-01-10 DIAGNOSIS — Z881 Allergy status to other antibiotic agents status: Secondary | ICD-10-CM | POA: Diagnosis not present

## 2018-01-10 DIAGNOSIS — Z888 Allergy status to other drugs, medicaments and biological substances status: Secondary | ICD-10-CM | POA: Diagnosis not present

## 2018-01-10 DIAGNOSIS — I10 Essential (primary) hypertension: Secondary | ICD-10-CM | POA: Insufficient documentation

## 2018-01-10 DIAGNOSIS — H2512 Age-related nuclear cataract, left eye: Secondary | ICD-10-CM | POA: Insufficient documentation

## 2018-01-10 DIAGNOSIS — Z9841 Cataract extraction status, right eye: Secondary | ICD-10-CM | POA: Diagnosis not present

## 2018-01-10 DIAGNOSIS — J45909 Unspecified asthma, uncomplicated: Secondary | ICD-10-CM | POA: Diagnosis not present

## 2018-01-10 DIAGNOSIS — M810 Age-related osteoporosis without current pathological fracture: Secondary | ICD-10-CM | POA: Diagnosis not present

## 2018-01-10 DIAGNOSIS — K219 Gastro-esophageal reflux disease without esophagitis: Secondary | ICD-10-CM | POA: Diagnosis not present

## 2018-01-10 DIAGNOSIS — Z79899 Other long term (current) drug therapy: Secondary | ICD-10-CM | POA: Insufficient documentation

## 2018-01-10 DIAGNOSIS — Z7982 Long term (current) use of aspirin: Secondary | ICD-10-CM | POA: Insufficient documentation

## 2018-01-10 DIAGNOSIS — H25812 Combined forms of age-related cataract, left eye: Secondary | ICD-10-CM | POA: Diagnosis not present

## 2018-01-10 HISTORY — PX: CATARACT EXTRACTION W/PHACO: SHX586

## 2018-01-10 SURGERY — PHACOEMULSIFICATION, CATARACT, WITH IOL INSERTION
Anesthesia: Monitor Anesthesia Care | Site: Eye | Laterality: Left | Wound class: "Clean "

## 2018-01-10 MED ORDER — MIDAZOLAM HCL 2 MG/2ML IJ SOLN
INTRAMUSCULAR | Status: DC | PRN
  Administered 2018-01-10: 1 mg via INTRAVENOUS

## 2018-01-10 MED ORDER — BRIMONIDINE TARTRATE-TIMOLOL 0.2-0.5 % OP SOLN
OPHTHALMIC | Status: DC | PRN
  Administered 2018-01-10: 1 [drp] via OPHTHALMIC

## 2018-01-10 MED ORDER — ACETAMINOPHEN 160 MG/5ML PO SOLN: 325.0000 mg | Freq: Once | ORAL | Status: DC

## 2018-01-10 MED ORDER — LIDOCAINE HCL (PF) 2 % IJ SOLN
INTRAOCULAR | Status: DC | PRN
  Administered 2018-01-10: 1 mL

## 2018-01-10 MED ORDER — ARMC OPHTHALMIC DILATING DROPS
1.0000 "application " | OPHTHALMIC | Status: DC | PRN
  Administered 2018-01-10 (×3): 1 via OPHTHALMIC

## 2018-01-10 MED ORDER — FENTANYL CITRATE (PF) 100 MCG/2ML IJ SOLN
INTRAMUSCULAR | Status: DC | PRN
  Administered 2018-01-10: 50 ug via INTRAVENOUS

## 2018-01-10 MED ORDER — CEFUROXIME OPHTHALMIC INJECTION 1 MG/0.1 ML
INJECTION | OPHTHALMIC | Status: DC | PRN
  Administered 2018-01-10: 0.1 mL via INTRACAMERAL

## 2018-01-10 MED ORDER — MOXIFLOXACIN HCL 0.5 % OP SOLN
1.0000 [drp] | OPHTHALMIC | Status: DC | PRN
  Administered 2018-01-10 (×3): 1 [drp] via OPHTHALMIC

## 2018-01-10 MED ORDER — EPINEPHRINE PF 1 MG/ML IJ SOLN
INTRAMUSCULAR | Status: DC | PRN
  Administered 2018-01-10: 62 mL via OPHTHALMIC

## 2018-01-10 MED ORDER — NA HYALUR & NA CHOND-NA HYALUR 0.4-0.35 ML IO KIT
PACK | INTRAOCULAR | Status: DC | PRN
  Administered 2018-01-10: 1 mL via INTRAOCULAR

## 2018-01-10 MED ORDER — ACETAMINOPHEN 325 MG PO TABS: 325.0000 mg | ORAL_TABLET | Freq: Once | ORAL | Status: DC

## 2018-01-10 MED ORDER — LACTATED RINGERS IV SOLN: INTRAVENOUS | Status: DC

## 2018-01-10 SURGICAL SUPPLY — 20 items
CANNULA ANT/CHMB 27G (MISCELLANEOUS) ×1 IMPLANT
CANNULA ANT/CHMB 27GA (MISCELLANEOUS) ×2 IMPLANT
GLOVE SURG LX 7.5 STRW (GLOVE) ×1
GLOVE SURG LX STRL 7.5 STRW (GLOVE) ×1 IMPLANT
GLOVE SURG TRIUMPH 8.0 PF LTX (GLOVE) ×2 IMPLANT
GOWN STRL REUS W/ TWL LRG LVL3 (GOWN DISPOSABLE) ×2 IMPLANT
GOWN STRL REUS W/TWL LRG LVL3 (GOWN DISPOSABLE) ×2
LENS IOL TECNIS ITEC 21.0 (Intraocular Lens) ×1 IMPLANT
MARKER SKIN DUAL TIP RULER LAB (MISCELLANEOUS) ×2 IMPLANT
NDL FILTER BLUNT 18X1 1/2 (NEEDLE) ×1 IMPLANT
NEEDLE FILTER BLUNT 18X 1/2SAF (NEEDLE) ×1
NEEDLE FILTER BLUNT 18X1 1/2 (NEEDLE) ×1 IMPLANT
PACK CATARACT BRASINGTON (MISCELLANEOUS) ×2 IMPLANT
PACK EYE AFTER SURG (MISCELLANEOUS) ×2 IMPLANT
PACK OPTHALMIC (MISCELLANEOUS) ×2 IMPLANT
SYR 3ML LL SCALE MARK (SYRINGE) ×2 IMPLANT
SYR 5ML LL (SYRINGE) ×2 IMPLANT
SYR TB 1ML LUER SLIP (SYRINGE) ×2 IMPLANT
WATER STERILE IRR 500ML POUR (IV SOLUTION) ×2 IMPLANT
WIPE NON LINTING 3.25X3.25 (MISCELLANEOUS) ×2 IMPLANT

## 2018-01-10 NOTE — Anesthesia Preprocedure Evaluation (Signed)
Anesthesia Evaluation  Patient identified by MRN, date of birth, ID band Patient awake    Reviewed: Allergy & Precautions, H&P , NPO status , Patient's Chart, lab work & pertinent test results, reviewed documented beta blocker date and time   History of Anesthesia Complications Negative for: history of anesthetic complications  Airway Mallampati: II  TM Distance: >3 FB Neck ROM: full    Dental no notable dental hx.    Pulmonary asthma ,    Pulmonary exam normal breath sounds clear to auscultation       Cardiovascular Exercise Tolerance: Good hypertension, Normal cardiovascular exam Rhythm:regular Rate:Normal     Neuro/Psych negative neurological ROS  negative psych ROS   GI/Hepatic GERD  Controlled,Fatty liver   Endo/Other  negative endocrine ROS  Renal/GU negative Renal ROS  negative genitourinary   Musculoskeletal Scoliosis mild   Abdominal   Peds  Hematology negative hematology ROS (+)   Anesthesia Other Findings   Reproductive/Obstetrics                             Anesthesia Physical  Anesthesia Plan  ASA: II  Anesthesia Plan: MAC   Post-op Pain Management:    Induction:   PONV Risk Score and Plan:   Airway Management Planned:   Additional Equipment:   Intra-op Plan:   Post-operative Plan:   Informed Consent: I have reviewed the patients History and Physical, chart, labs and discussed the procedure including the risks, benefits and alternatives for the proposed anesthesia with the patient or authorized representative who has indicated his/her understanding and acceptance.     Plan Discussed with: CRNA  Anesthesia Plan Comments:         Anesthesia Quick Evaluation

## 2018-01-10 NOTE — Transfer of Care (Signed)
Immediate Anesthesia Transfer of Care Note  Patient: Janet Rios  Procedure(s) Performed: CATARACT EXTRACTION PHACO AND INTRAOCULAR LENS PLACEMENT (IOC)  LEFT (Left Eye)  Patient Location: PACU  Anesthesia Type: MAC  Level of Consciousness: awake, alert  and patient cooperative  Airway and Oxygen Therapy: Patient Spontanous Breathing and Patient connected to supplemental oxygen  Post-op Assessment: Post-op Vital signs reviewed, Patient's Cardiovascular Status Stable, Respiratory Function Stable, Patent Airway and No signs of Nausea or vomiting  Post-op Vital Signs: Reviewed and stable  Complications: No apparent anesthesia complications

## 2018-01-10 NOTE — Anesthesia Procedure Notes (Signed)
Procedure Name: MAC Date/Time: 01/10/2018 10:13 AM Performed by: Janna Arch, CRNA Pre-anesthesia Checklist: Patient identified, Emergency Drugs available, Suction available, Patient being monitored and Timeout performed Oxygen Delivery Method: Nasal cannula

## 2018-01-10 NOTE — Op Note (Signed)
OPERATIVE NOTE  KALASIA CRAFTON [id number] 01/10/2018   PREOPERATIVE DIAGNOSIS:  Nuclear sclerotic cataract left eye. H25.12   POSTOPERATIVE DIAGNOSIS:    Nuclear sclerotic cataract left eye.     PROCEDURE:  Phacoemusification with posterior chamber intraocular lens placement of the left eye   LENS:   Implant Name Type Inv. Item Serial No. Manufacturer Lot No. LRB No. Used  LENS IOL DIOP 21.0 - [id number] Intraocular Lens LENS IOL DIOP 21.0 [id number] AMO  Left 1        ULTRASOUND TIME: 22  % of 1 minutes 35 seconds, CDE 21.1  SURGEON:  Wyonia Hough, MD   ANESTHESIA:  Topical with tetracaine drops and 2% Xylocaine jelly, augmented with 1% preservative-free intracameral lidocaine.    COMPLICATIONS:  None.   DESCRIPTION OF PROCEDURE:  The patient was identified in the holding room and transported to the operating room and placed in the supine position under the operating microscope.  The left eye was identified as the operative eye and it was prepped and draped in the usual sterile ophthalmic fashion.   A 1 millimeter clear-corneal paracentesis was made at the 1:30 position.  0.5 ml of preservative-free 1% lidocaine was injected into the anterior chamber.  The anterior chamber was filled with Viscoat viscoelastic.  A 2.4 millimeter keratome was used to make a near-clear corneal incision at the 10:30 position.  .  A curvilinear capsulorrhexis was made with a cystotome and capsulorrhexis forceps.  Balanced salt solution was used to hydrodissect and hydrodelineate the nucleus.   Phacoemulsification was then used in stop and chop fashion to remove the lens nucleus and epinucleus.  The remaining cortex was then removed using the irrigation and aspiration handpiece. Provisc was then placed into the capsular bag to distend it for lens placement.  A lens was then injected into the capsular bag.  The remaining viscoelastic was aspirated.   Wounds were hydrated with balanced salt  solution.  The anterior chamber was inflated to a physiologic pressure with balanced salt solution.  No wound leaks were noted. Cefuroxime 0.1 ml of a 10mg /ml solution was injected into the anterior chamber for a dose of 1 mg of intracameral antibiotic at the completion of the case.   Timolol and Brimonidine drops were applied to the eye.  The patient was taken to the recovery room in stable condition without complications of anesthesia or surgery.  Emonie Espericueta 01/10/2018, 10:28 AM

## 2018-01-10 NOTE — H&P (Signed)
The History and Physical notes are on paper, have been signed, and are to be scanned. The patient remains stable and unchanged from the H&P.   Previous H&P reviewed, patient examined, and there are no changes.  Janet Rios 01/10/2018 9:43 AM

## 2018-01-10 NOTE — Anesthesia Postprocedure Evaluation (Signed)
Anesthesia Post Note  Patient: Janet Rios  Procedure(s) Performed: CATARACT EXTRACTION PHACO AND INTRAOCULAR LENS PLACEMENT (IOC)  LEFT (Left Eye)  Patient location during evaluation: PACU Anesthesia Type: MAC Level of consciousness: awake and alert and oriented Pain management: satisfactory to patient Vital Signs Assessment: post-procedure vital signs reviewed and stable Respiratory status: spontaneous breathing, nonlabored ventilation and respiratory function stable Cardiovascular status: blood pressure returned to baseline and stable Postop Assessment: Adequate PO intake and No signs of nausea or vomiting Anesthetic complications: no    Raliegh Ip

## 2018-01-11 ENCOUNTER — Encounter: Payer: Self-pay | Admitting: Ophthalmology

## 2018-01-17 DIAGNOSIS — J4530 Mild persistent asthma, uncomplicated: Secondary | ICD-10-CM | POA: Diagnosis not present

## 2018-02-02 DIAGNOSIS — R7989 Other specified abnormal findings of blood chemistry: Secondary | ICD-10-CM | POA: Diagnosis not present

## 2018-02-02 DIAGNOSIS — E7849 Other hyperlipidemia: Secondary | ICD-10-CM | POA: Diagnosis not present

## 2018-02-08 DIAGNOSIS — Z79899 Other long term (current) drug therapy: Secondary | ICD-10-CM | POA: Diagnosis not present

## 2018-02-08 DIAGNOSIS — E7849 Other hyperlipidemia: Secondary | ICD-10-CM | POA: Diagnosis not present

## 2018-02-08 DIAGNOSIS — J449 Chronic obstructive pulmonary disease, unspecified: Secondary | ICD-10-CM | POA: Diagnosis not present

## 2018-02-19 DIAGNOSIS — Z961 Presence of intraocular lens: Secondary | ICD-10-CM | POA: Diagnosis not present

## 2018-03-02 DIAGNOSIS — R42 Dizziness and giddiness: Secondary | ICD-10-CM | POA: Diagnosis not present

## 2018-03-02 DIAGNOSIS — I10 Essential (primary) hypertension: Secondary | ICD-10-CM | POA: Diagnosis not present

## 2018-03-05 ENCOUNTER — Ambulatory Visit
Admission: RE | Admit: 2018-03-05 | Discharge: 2018-03-05 | Disposition: A | Discharge status: Home / Self Care | Payer: Medicare Other | Source: Ambulatory Visit | Attending: Internal Medicine | Admitting: Internal Medicine

## 2018-03-05 DIAGNOSIS — Z1231 Encounter for screening mammogram for malignant neoplasm of breast: Secondary | ICD-10-CM

## 2018-04-04 DIAGNOSIS — I10 Essential (primary) hypertension: Secondary | ICD-10-CM | POA: Diagnosis not present

## 2018-04-18 DIAGNOSIS — J449 Chronic obstructive pulmonary disease, unspecified: Secondary | ICD-10-CM | POA: Diagnosis not present

## 2018-05-01 DIAGNOSIS — J310 Chronic rhinitis: Secondary | ICD-10-CM | POA: Diagnosis not present

## 2018-05-01 DIAGNOSIS — J209 Acute bronchitis, unspecified: Secondary | ICD-10-CM | POA: Diagnosis not present

## 2018-05-08 DIAGNOSIS — Z23 Encounter for immunization: Secondary | ICD-10-CM | POA: Diagnosis not present

## 2018-06-06 DIAGNOSIS — S99912A Unspecified injury of left ankle, initial encounter: Secondary | ICD-10-CM | POA: Diagnosis not present

## 2018-06-06 DIAGNOSIS — S93402A Sprain of unspecified ligament of left ankle, initial encounter: Secondary | ICD-10-CM | POA: Diagnosis not present

## 2018-06-06 DIAGNOSIS — M7989 Other specified soft tissue disorders: Secondary | ICD-10-CM | POA: Diagnosis not present

## 2018-06-06 DIAGNOSIS — W1842XA Slipping, tripping and stumbling without falling due to stepping into hole or opening, initial encounter: Secondary | ICD-10-CM | POA: Diagnosis not present

## 2018-06-06 DIAGNOSIS — M25572 Pain in left ankle and joints of left foot: Secondary | ICD-10-CM | POA: Diagnosis not present

## 2018-07-20 DIAGNOSIS — I10 Essential (primary) hypertension: Secondary | ICD-10-CM | POA: Diagnosis not present

## 2018-07-20 DIAGNOSIS — J449 Chronic obstructive pulmonary disease, unspecified: Secondary | ICD-10-CM | POA: Diagnosis not present

## 2018-07-20 DIAGNOSIS — K219 Gastro-esophageal reflux disease without esophagitis: Secondary | ICD-10-CM | POA: Diagnosis not present

## 2018-07-28 DIAGNOSIS — J441 Chronic obstructive pulmonary disease with (acute) exacerbation: Secondary | ICD-10-CM | POA: Diagnosis not present

## 2018-08-07 DIAGNOSIS — E7849 Other hyperlipidemia: Secondary | ICD-10-CM | POA: Diagnosis not present

## 2018-08-07 DIAGNOSIS — Z79899 Other long term (current) drug therapy: Secondary | ICD-10-CM | POA: Diagnosis not present

## 2018-08-07 DIAGNOSIS — R7989 Other specified abnormal findings of blood chemistry: Secondary | ICD-10-CM | POA: Diagnosis not present

## 2018-08-08 DIAGNOSIS — Z79899 Other long term (current) drug therapy: Secondary | ICD-10-CM | POA: Diagnosis not present

## 2018-08-08 DIAGNOSIS — E7849 Other hyperlipidemia: Secondary | ICD-10-CM | POA: Diagnosis not present

## 2018-08-11 DIAGNOSIS — J441 Chronic obstructive pulmonary disease with (acute) exacerbation: Secondary | ICD-10-CM | POA: Diagnosis not present

## 2018-08-14 DIAGNOSIS — M818 Other osteoporosis without current pathological fracture: Secondary | ICD-10-CM | POA: Diagnosis not present

## 2018-08-14 DIAGNOSIS — E7849 Other hyperlipidemia: Secondary | ICD-10-CM | POA: Diagnosis not present

## 2018-08-14 DIAGNOSIS — Z0000 Encounter for general adult medical examination without abnormal findings: Secondary | ICD-10-CM | POA: Diagnosis not present

## 2018-08-14 DIAGNOSIS — J449 Chronic obstructive pulmonary disease, unspecified: Secondary | ICD-10-CM | POA: Diagnosis not present

## 2018-08-17 DIAGNOSIS — J441 Chronic obstructive pulmonary disease with (acute) exacerbation: Secondary | ICD-10-CM | POA: Diagnosis not present

## 2018-08-17 DIAGNOSIS — J209 Acute bronchitis, unspecified: Secondary | ICD-10-CM | POA: Diagnosis not present

## 2018-08-17 DIAGNOSIS — J019 Acute sinusitis, unspecified: Secondary | ICD-10-CM | POA: Diagnosis not present

## 2018-08-17 DIAGNOSIS — B9689 Other specified bacterial agents as the cause of diseases classified elsewhere: Secondary | ICD-10-CM | POA: Diagnosis not present

## 2018-08-21 DIAGNOSIS — Z961 Presence of intraocular lens: Secondary | ICD-10-CM | POA: Diagnosis not present

## 2018-08-21 DIAGNOSIS — M810 Age-related osteoporosis without current pathological fracture: Secondary | ICD-10-CM | POA: Diagnosis not present

## 2018-08-28 DIAGNOSIS — J310 Chronic rhinitis: Secondary | ICD-10-CM | POA: Diagnosis not present

## 2018-08-28 DIAGNOSIS — J449 Chronic obstructive pulmonary disease, unspecified: Secondary | ICD-10-CM | POA: Diagnosis not present

## 2018-10-10 DIAGNOSIS — L821 Other seborrheic keratosis: Secondary | ICD-10-CM | POA: Diagnosis not present

## 2018-11-09 DIAGNOSIS — J301 Allergic rhinitis due to pollen: Secondary | ICD-10-CM | POA: Diagnosis not present

## 2018-11-21 DIAGNOSIS — J310 Chronic rhinitis: Secondary | ICD-10-CM | POA: Diagnosis not present

## 2018-11-21 DIAGNOSIS — J449 Chronic obstructive pulmonary disease, unspecified: Secondary | ICD-10-CM | POA: Diagnosis not present

## 2018-11-21 DIAGNOSIS — I459 Conduction disorder, unspecified: Secondary | ICD-10-CM | POA: Diagnosis not present

## 2018-11-27 DIAGNOSIS — I493 Ventricular premature depolarization: Secondary | ICD-10-CM | POA: Diagnosis not present

## 2018-11-27 DIAGNOSIS — K219 Gastro-esophageal reflux disease without esophagitis: Secondary | ICD-10-CM | POA: Diagnosis not present

## 2018-11-27 DIAGNOSIS — R002 Palpitations: Secondary | ICD-10-CM | POA: Diagnosis not present

## 2018-12-04 DIAGNOSIS — I6523 Occlusion and stenosis of bilateral carotid arteries: Secondary | ICD-10-CM | POA: Diagnosis not present

## 2018-12-04 DIAGNOSIS — I493 Ventricular premature depolarization: Secondary | ICD-10-CM | POA: Diagnosis not present

## 2018-12-07 DIAGNOSIS — I493 Ventricular premature depolarization: Secondary | ICD-10-CM | POA: Diagnosis not present

## 2018-12-18 DIAGNOSIS — I493 Ventricular premature depolarization: Secondary | ICD-10-CM | POA: Diagnosis not present

## 2018-12-18 DIAGNOSIS — K219 Gastro-esophageal reflux disease without esophagitis: Secondary | ICD-10-CM | POA: Diagnosis not present

## 2018-12-25 DIAGNOSIS — J309 Allergic rhinitis, unspecified: Secondary | ICD-10-CM | POA: Diagnosis not present

## 2018-12-25 DIAGNOSIS — K219 Gastro-esophageal reflux disease without esophagitis: Secondary | ICD-10-CM | POA: Diagnosis not present

## 2019-01-31 ENCOUNTER — Other Ambulatory Visit: Payer: Self-pay | Admitting: Internal Medicine

## 2019-01-31 DIAGNOSIS — Z1231 Encounter for screening mammogram for malignant neoplasm of breast: Secondary | ICD-10-CM

## 2019-02-05 DIAGNOSIS — E7849 Other hyperlipidemia: Secondary | ICD-10-CM | POA: Diagnosis not present

## 2019-02-12 DIAGNOSIS — E7849 Other hyperlipidemia: Secondary | ICD-10-CM | POA: Diagnosis not present

## 2019-02-12 DIAGNOSIS — I493 Ventricular premature depolarization: Secondary | ICD-10-CM | POA: Diagnosis not present

## 2019-02-12 DIAGNOSIS — M818 Other osteoporosis without current pathological fracture: Secondary | ICD-10-CM | POA: Diagnosis not present

## 2019-02-12 DIAGNOSIS — K219 Gastro-esophageal reflux disease without esophagitis: Secondary | ICD-10-CM | POA: Diagnosis not present

## 2019-03-11 ENCOUNTER — Ambulatory Visit
Admission: RE | Admit: 2019-03-11 | Discharge: 2019-03-11 | Disposition: A | Discharge status: Home / Self Care | Payer: Medicare Other | Source: Ambulatory Visit | Attending: Internal Medicine | Admitting: Internal Medicine

## 2019-03-11 ENCOUNTER — Other Ambulatory Visit: Payer: Self-pay

## 2019-03-11 DIAGNOSIS — Z1231 Encounter for screening mammogram for malignant neoplasm of breast: Secondary | ICD-10-CM | POA: Diagnosis not present

## 2019-03-21 DIAGNOSIS — J449 Chronic obstructive pulmonary disease, unspecified: Secondary | ICD-10-CM | POA: Diagnosis not present

## 2019-03-21 DIAGNOSIS — K219 Gastro-esophageal reflux disease without esophagitis: Secondary | ICD-10-CM | POA: Diagnosis not present

## 2019-04-12 DIAGNOSIS — H43812 Vitreous degeneration, left eye: Secondary | ICD-10-CM | POA: Diagnosis not present

## 2019-11-13 ENCOUNTER — Other Ambulatory Visit: Payer: Self-pay | Admitting: Internal Medicine

## 2019-11-13 DIAGNOSIS — Z1231 Encounter for screening mammogram for malignant neoplasm of breast: Secondary | ICD-10-CM

## 2019-11-29 IMAGING — MG DIGITAL SCREENING BILATERAL MAMMOGRAM WITH TOMO AND CAD
8 series · 8 of 24 positions shown · non-contrast
Comparison: Previous exam(s).

CLINICAL DATA: Screening.

EXAM:
DIGITAL SCREENING BILATERAL MAMMOGRAM WITH TOMO AND CAD

[R MLO synth-2D]
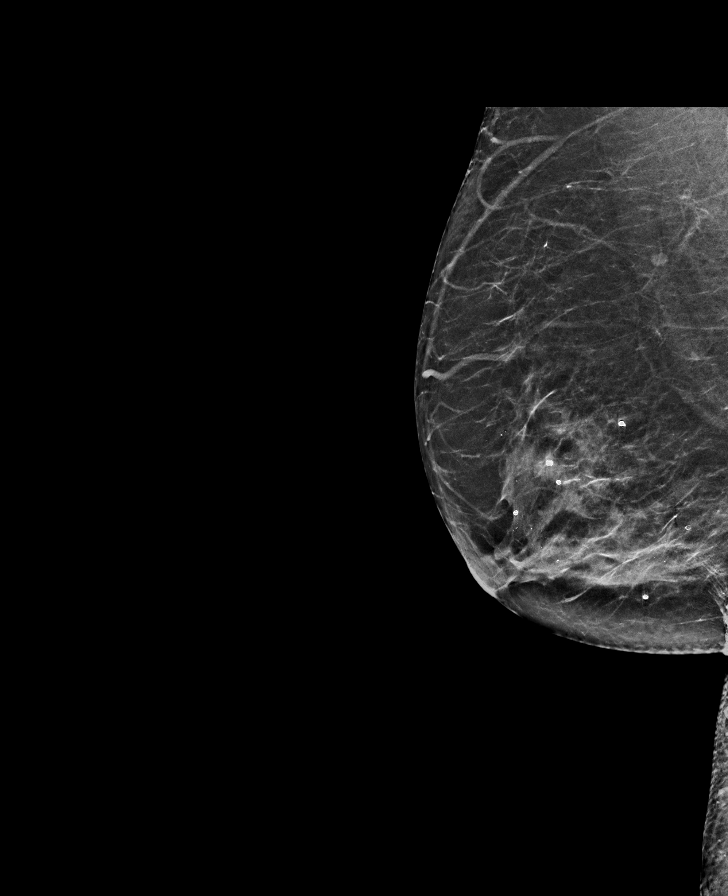

[L CC synth-2D]
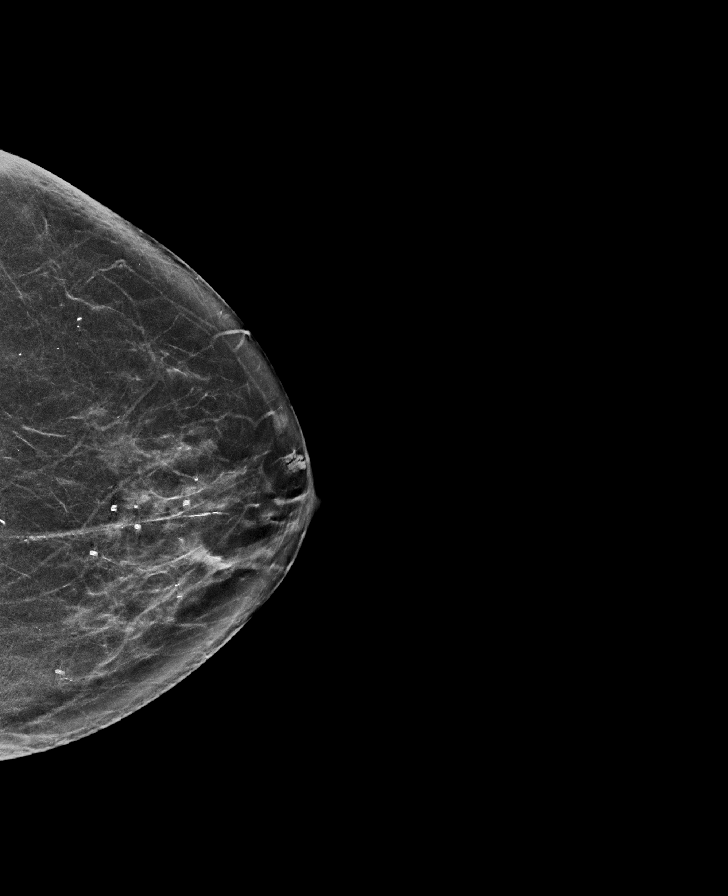

[L MLO synth-2D]
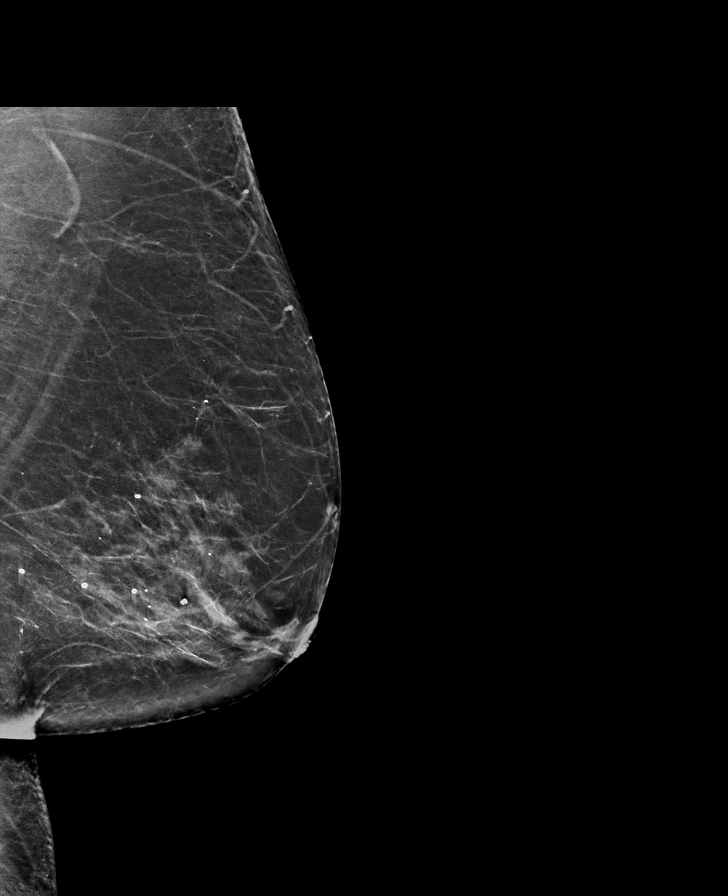

[R CC synth-2D]
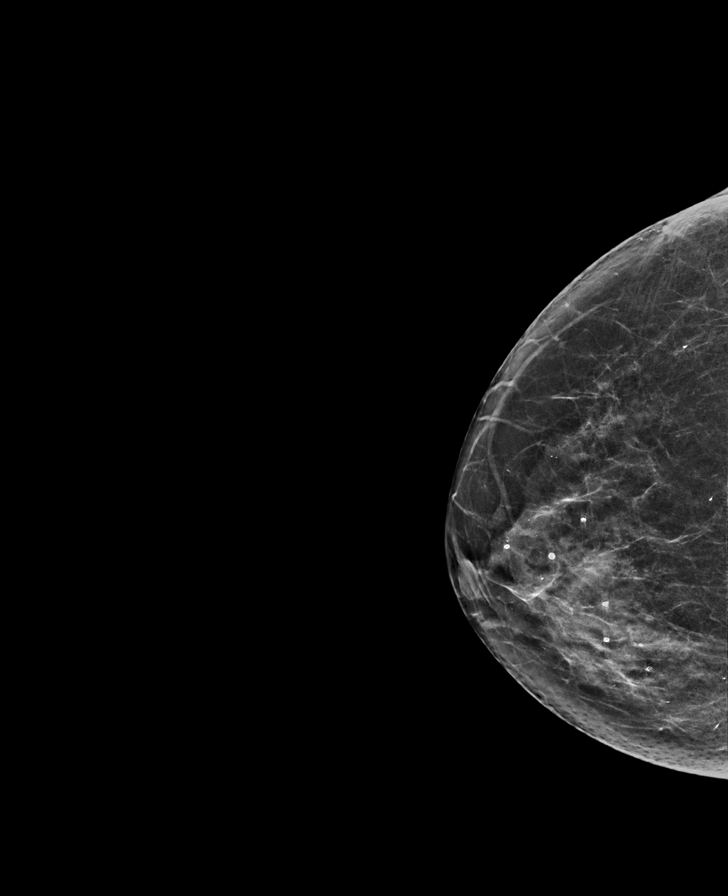

[L MLO tomo · tomo slice 37/73.0]
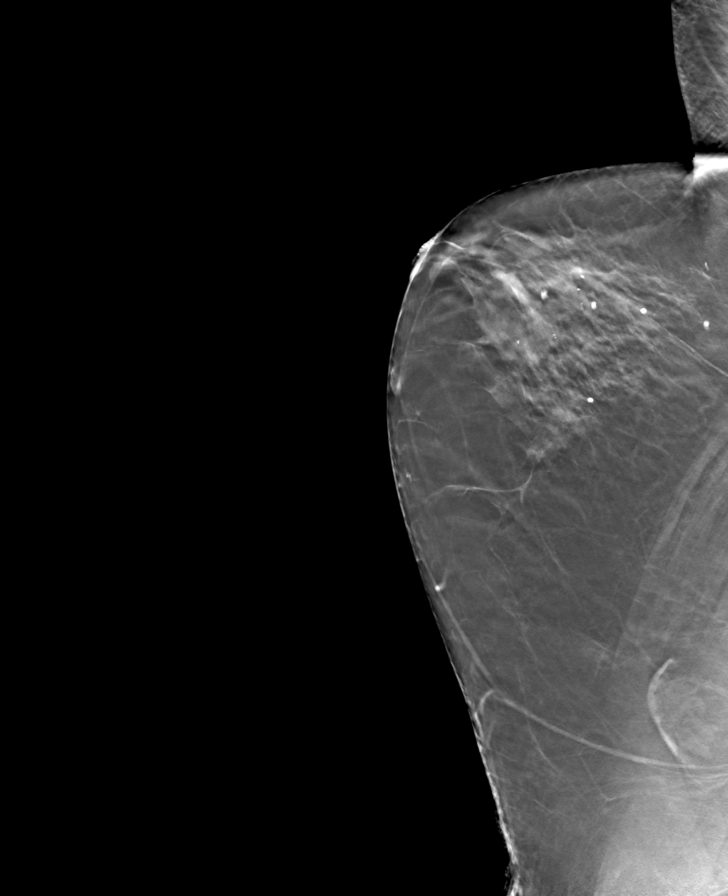

[L CC tomo · tomo slice 33/65.0]
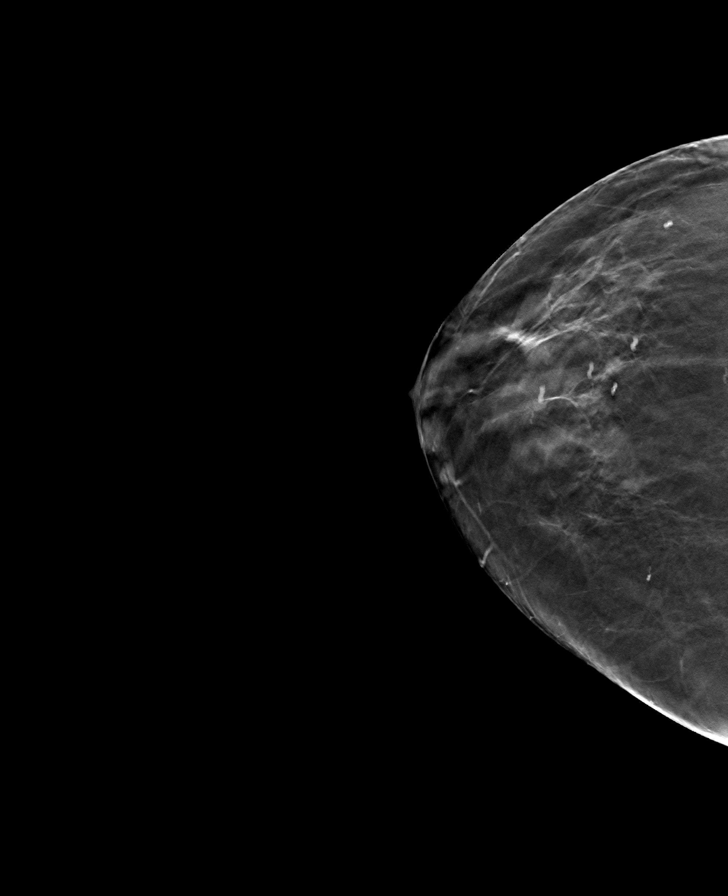

[R MLO tomo · tomo slice 32/63.0]
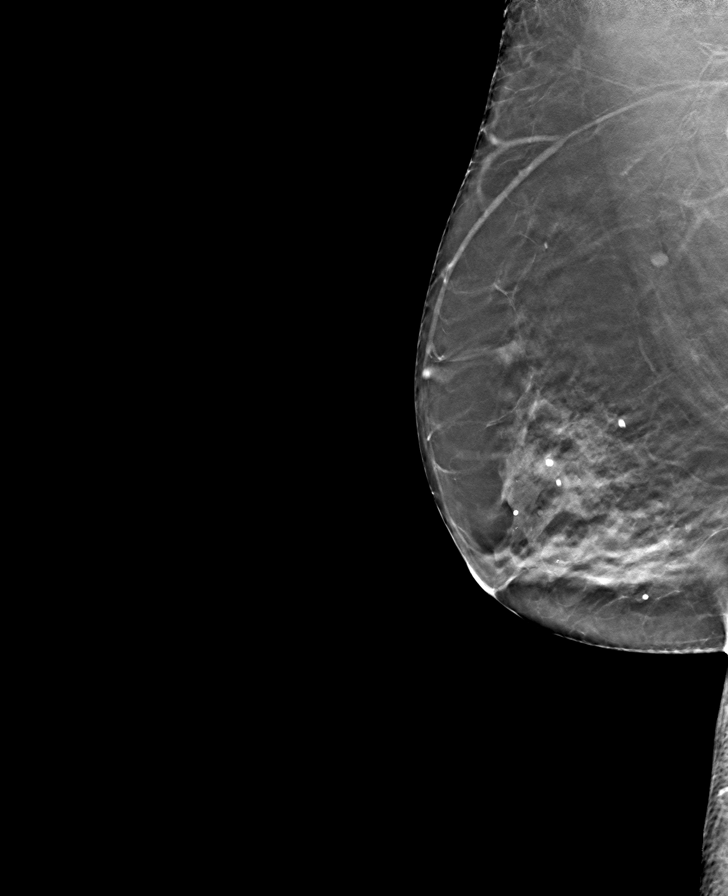

[R CC tomo · tomo slice 32/63.0]
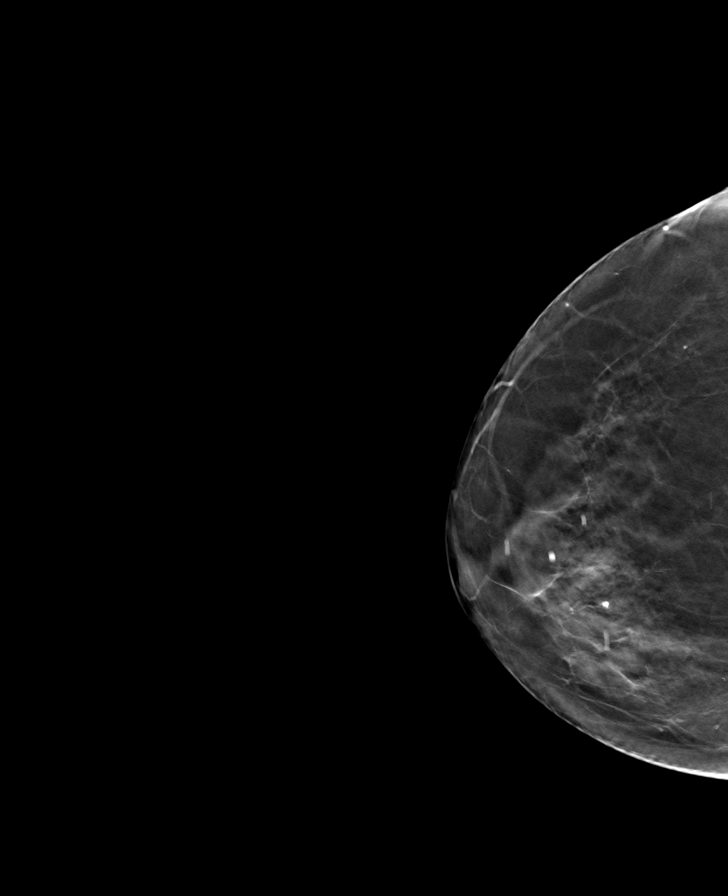

[8 of 24 positions shown; findings below may reference images not displayed]

ACR Breast Density Category c: The breast tissue is heterogeneously
dense, which may obscure small masses.
FINDINGS: There are no findings suspicious for malignancy. Images were
processed with CAD.
IMPRESSION: No mammographic evidence of malignancy. A result letter of this
screening mammogram will be mailed directly to the patient.

RECOMMENDATION:
Screening mammogram in one year. (Code:[id number])

BI-RADS CATEGORY  1: Negative.

## 2019-12-13 ENCOUNTER — Ambulatory Visit: Payer: Medicare Other | Attending: Internal Medicine

## 2019-12-13 DIAGNOSIS — Z23 Encounter for immunization: Secondary | ICD-10-CM

## 2019-12-13 NOTE — Progress Notes (Signed)
   Covid-19 Vaccination Clinic  Name:  JAQUAYA HELDMAN    MRN: [id number] DOB: [birth year]  12/13/2019  Janet Rios was observed post Covid-19 immunization for 15 minutes without incident. She was provided with Vaccine Information Sheet and instruction to access the V-Safe system.   Ms. Raczynski was instructed to call 911 with any severe reactions post vaccine: Marland Kitchen Difficulty breathing  . Swelling of face and throat  . A fast heartbeat  . A bad rash all over body  . Dizziness and weakness   Immunizations Administered    Name Date Dose VIS Date Route   Pfizer COVID-19 Vaccine 12/13/2019 10:51 AM 0.3 mL 09/25/2018 Intramuscular   Manufacturer: [place]   Lot: [id number]   [place]: [id number]

## 2020-01-07 ENCOUNTER — Ambulatory Visit: Payer: Medicare Other | Attending: Internal Medicine

## 2020-01-07 DIAGNOSIS — Z23 Encounter for immunization: Secondary | ICD-10-CM

## 2020-01-07 NOTE — Progress Notes (Signed)
   Covid-19 Vaccination Clinic  Name:  CLEMIE GENERAL    MRN: [id number] DOB: May 23, 1941  01/07/2020  Janet Rios was observed post Covid-19 immunization for 15 minutes without incident. She was provided with Vaccine Information Sheet and instruction to access the V-Safe system.   [name] was instructed to call 911 with any severe reactions post vaccine: Marland Kitchen Difficulty breathing  . Swelling of face and throat  . A fast heartbeat  . A bad rash all over body  . Dizziness and weakness   Immunizations Administered    Name Date Dose VIS Date Route   Pfizer COVID-19 Vaccine 01/07/2020 11:16 AM 0.3 mL 09/25/2018 Intramuscular   Manufacturer: [place]   Lot: [id number]   [place]: [id number]     o

## 2020-03-11 ENCOUNTER — Other Ambulatory Visit: Payer: Self-pay

## 2020-03-11 ENCOUNTER — Ambulatory Visit
Admission: RE | Admit: 2020-03-11 | Discharge: 2020-03-11 | Disposition: A | Discharge status: Home / Self Care | Payer: Medicare Other | Source: Ambulatory Visit | Attending: Internal Medicine | Admitting: Internal Medicine

## 2020-03-11 DIAGNOSIS — Z1231 Encounter for screening mammogram for malignant neoplasm of breast: Secondary | ICD-10-CM | POA: Diagnosis present

## 2020-07-02 ENCOUNTER — Other Ambulatory Visit (HOSPITAL_COMMUNITY): Payer: Self-pay | Admitting: Internal Medicine

## 2020-07-02 DIAGNOSIS — K219 Gastro-esophageal reflux disease without esophagitis: Secondary | ICD-10-CM

## 2020-07-02 DIAGNOSIS — R053 Chronic cough: Secondary | ICD-10-CM

## 2020-07-09 ENCOUNTER — Other Ambulatory Visit: Payer: Self-pay

## 2020-07-09 ENCOUNTER — Ambulatory Visit
Admission: RE | Admit: 2020-07-09 | Discharge: 2020-07-09 | Disposition: A | Discharge status: Home / Self Care | Payer: Medicare Other | Source: Ambulatory Visit | Attending: Internal Medicine | Admitting: Internal Medicine

## 2020-07-09 DIAGNOSIS — R053 Chronic cough: Secondary | ICD-10-CM | POA: Diagnosis present

## 2020-07-09 DIAGNOSIS — K219 Gastro-esophageal reflux disease without esophagitis: Secondary | ICD-10-CM | POA: Diagnosis present

## 2020-12-15 ENCOUNTER — Encounter: Payer: Self-pay | Admitting: Internal Medicine

## 2020-12-16 ENCOUNTER — Other Ambulatory Visit: Payer: Self-pay

## 2020-12-16 ENCOUNTER — Ambulatory Visit
Admission: RE | Admit: 2020-12-16 | Discharge: 2020-12-16 | Disposition: A | Discharge status: Home / Self Care | Payer: Medicare Other | Attending: Internal Medicine | Admitting: Internal Medicine

## 2020-12-16 ENCOUNTER — Ambulatory Visit: Payer: Medicare Other | Admitting: Anesthesiology

## 2020-12-16 ENCOUNTER — Encounter: Payer: Self-pay | Admitting: Internal Medicine

## 2020-12-16 ENCOUNTER — Encounter
Admission: RE | Disposition: A | Discharge status: Home / Self Care | Payer: Self-pay | Source: Home / Self Care | Attending: Internal Medicine

## 2020-12-16 DIAGNOSIS — Z880 Allergy status to penicillin: Secondary | ICD-10-CM | POA: Insufficient documentation

## 2020-12-16 DIAGNOSIS — Z86010 Personal history of colonic polyps: Secondary | ICD-10-CM | POA: Diagnosis present

## 2020-12-16 DIAGNOSIS — D120 Benign neoplasm of cecum: Secondary | ICD-10-CM | POA: Diagnosis not present

## 2020-12-16 DIAGNOSIS — Z7951 Long term (current) use of inhaled steroids: Secondary | ICD-10-CM | POA: Diagnosis not present

## 2020-12-16 DIAGNOSIS — K640 First degree hemorrhoids: Secondary | ICD-10-CM | POA: Insufficient documentation

## 2020-12-16 DIAGNOSIS — Z1211 Encounter for screening for malignant neoplasm of colon: Secondary | ICD-10-CM | POA: Diagnosis not present

## 2020-12-16 DIAGNOSIS — Z79899 Other long term (current) drug therapy: Secondary | ICD-10-CM | POA: Insufficient documentation

## 2020-12-16 DIAGNOSIS — Z7982 Long term (current) use of aspirin: Secondary | ICD-10-CM | POA: Insufficient documentation

## 2020-12-16 DIAGNOSIS — Z888 Allergy status to other drugs, medicaments and biological substances status: Secondary | ICD-10-CM | POA: Insufficient documentation

## 2020-12-16 HISTORY — DX: Dyspnea, unspecified: R06.00

## 2020-12-16 HISTORY — DX: Unspecified osteoarthritis, unspecified site: M19.90

## 2020-12-16 HISTORY — PX: COLONOSCOPY WITH PROPOFOL: SHX5780

## 2020-12-16 HISTORY — DX: Chronic obstructive pulmonary disease, unspecified: J44.9

## 2020-12-16 SURGERY — COLONOSCOPY WITH PROPOFOL
Anesthesia: General

## 2020-12-16 MED ORDER — PROPOFOL 500 MG/50ML IV EMUL
INTRAVENOUS | Status: DC | PRN
  Administered 2020-12-16: 145 ug/kg/min via INTRAVENOUS

## 2020-12-16 MED ORDER — GLYCOPYRROLATE 0.2 MG/ML IJ SOLN
INTRAMUSCULAR | Status: DC | PRN
  Administered 2020-12-16: .2 mg via INTRAVENOUS

## 2020-12-16 MED ORDER — LIDOCAINE HCL (CARDIAC) PF 100 MG/5ML IV SOSY
PREFILLED_SYRINGE | INTRAVENOUS | Status: DC | PRN
  Administered 2020-12-16: 100 mg via INTRAVENOUS

## 2020-12-16 MED ORDER — PROPOFOL 10 MG/ML IV BOLUS
INTRAVENOUS | Status: DC | PRN
  Administered 2020-12-16: 50 mg via INTRAVENOUS

## 2020-12-16 MED ORDER — SODIUM CHLORIDE 0.9 % IV SOLN: INTRAVENOUS | Status: DC

## 2020-12-16 NOTE — Anesthesia Procedure Notes (Signed)
Procedure Name: General with mask airway Performed by: Fletcher-Harrison, Yisroel Mullendore, CRNA Pre-anesthesia Checklist: Patient identified, Emergency Drugs available, Suction available and Patient being monitored Patient Re-evaluated:Patient Re-evaluated prior to induction Oxygen Delivery Method: Simple face mask Induction Type: IV induction Placement Confirmation: positive ETCO2 and CO2 detector Dental Injury: Teeth and Oropharynx as per pre-operative assessment        

## 2020-12-16 NOTE — Transfer of Care (Addendum)
Immediate Anesthesia Transfer of Care Note  Patient: Janet Rios  Procedure(s) Performed: COLONOSCOPY WITH PROPOFOL (N/A )  Patient Location: Endoscopy Unit  Anesthesia Type:General  Level of Consciousness: awake, drowsy and patient cooperative  Airway & Oxygen Therapy: Patient Spontanous Breathing and Patient connected to face mask oxygen  Post-op Assessment: Report given to RN and Post -op Vital signs reviewed and stable  Post vital signs: Reviewed and stable  Last Vitals:  Vitals Value Taken Time  BP 114/49 12/16/20 1024  Temp 36.2 C 12/16/20 1024  Pulse 78 12/16/20 1026  Resp 18 12/16/20 1026  SpO2 100 % 12/16/20 1026  Vitals shown include unvalidated device data.  Last Pain:  Vitals:   12/16/20 1024  TempSrc: Temporal  PainSc: Asleep         Complications: No complications documented.

## 2020-12-16 NOTE — H&P (Signed)
Outpatient short stay form Pre-procedure 12/16/2020 9:54 AM Janet Rios K. Alice Reichert, M.D.  Primary Physician: Emily Filbert, M.D.  Reason for visit:  Tubular adenoma of the colon  History of present illness:                            Patient presents for colonoscopy for a personal hx of colon polyps. The patient denies abdominal pain, abnormal weight loss or rectal bleeding.      Current Facility-Administered Medications:  .  0.9 %  sodium chloride infusion, , Intravenous, Continuous, [place], Benay Pike, MD, Last Rate: 20 mL/hr at 12/16/20 0950, New Bag at 12/16/20 0950  Medications Prior to Admission  Medication Sig Dispense Refill Last Dose  . aspirin 81 MG tablet Take 81 mg by mouth daily.   12/15/2020 at Unknown time  . budesonide-formoterol (SYMBICORT) 160-4.5 MCG/ACT inhaler Inhale 2 puffs into the lungs daily.   12/15/2020 at Unknown time  . Calcium Carbonate (CALCIUM 600 PO) Take by mouth daily.   12/15/2020 at Unknown time  . Cholecalciferol (VITAMIN D3) 2000 units capsule Take 2,000 Units by mouth daily.   12/15/2020 at Unknown time  . co-enzyme Q-10 30 MG capsule Take 100 mg by mouth 3 (three) times daily.   12/15/2020 at Unknown time  . esomeprazole (NEXIUM) 40 MG capsule Take 40 mg by mouth daily at 12 noon.   12/15/2020 at Unknown time  . fluticasone (FLONASE) 50 MCG/ACT nasal spray Place into both nostrils daily.   12/15/2020 at Unknown time  . folic acid (FOLVITE) 073 MCG tablet Take 800 mcg by mouth daily.   12/15/2020 at Unknown time  . lisinopril-hydrochlorothiazide (PRINZIDE,ZESTORETIC) 10-12.5 MG tablet Take 1 tablet by mouth 2 (two) times daily.   12/15/2020 at Unknown time  . metoprolol (LOPRESSOR) 50 MG tablet Take 50 mg by mouth 2 (two) times daily.   12/16/2020 at Unknown time  . montelukast (SINGULAIR) 10 MG tablet Take 10 mg by mouth at bedtime.   12/15/2020 at Unknown time  . pantoprazole (PROTONIX) 40 MG tablet Take 40 mg by mouth daily. Reported on 07/31/2015   12/15/2020 at  Unknown time  . vitamin B-12 (CYANOCOBALAMIN) 250 MCG tablet Take 250 mcg by mouth daily.   12/15/2020 at Unknown time  . Vitamin D, Ergocalciferol, (DRISDOL) 50000 units CAPS capsule Take 50,000 Units by mouth every 7 (seven) days.   12/15/2020 at Unknown time  . albuterol (PROVENTIL HFA;VENTOLIN HFA) 108 (90 Base) MCG/ACT inhaler Inhale 2 puffs into the lungs every 4 (four) hours as needed for wheezing or shortness of breath.     . levocetirizine (XYZAL) 5 MG tablet Take 5 mg by mouth every evening.        Allergies  Allergen Reactions  . Alendronate Other (See Comments)    Joint pain  . Statins Other (See Comments)    Muscle pain  . Amoxicillin Rash     Past Medical History:  Diagnosis Date  . Arthritis   . Asthma    Ashtma without status asthmaticus  . COPD (chronic obstructive pulmonary disease) ([place])   . Dyspnea   . Esophageal stricture   . Fatty liver   . GERD (gastroesophageal reflux disease)   . Hypertension   . Lipids serum increased   . Obesity   . Osteoporosis   . Scoliosis   . Trigger finger, acquired   . Vertigo    no episodes in over 1 yr  . Vitamin  D deficiency     Review of systems:  Otherwise negative.    Physical Exam  Gen: Alert, oriented. Appears stated age.  HEENT: [place]/AT. PERRLA. Lungs: CTA, no wheezes. CV: RR nl S1, S2. Abd: soft, benign, no masses. BS+ Ext: No edema. Pulses 2+    Planned procedures: Proceed with colonoscopy. The patient understands the nature of the planned procedure, indications, risks, alternatives and potential complications including but not limited to bleeding, infection, perforation, damage to internal organs and possible oversedation/side effects from anesthesia. The patient agrees and gives consent to proceed.  Please refer to procedure notes for findings, recommendations and patient disposition/instructions.     Falon Flinchum K. Alice Reichert, M.D. Gastroenterology 12/16/2020  9:54 AM

## 2020-12-16 NOTE — Anesthesia Preprocedure Evaluation (Signed)
Anesthesia Evaluation  Patient identified by MRN, date of birth, ID band Patient awake    Reviewed: Allergy & Precautions, NPO status , Patient's Chart, lab work & pertinent test results  History of Anesthesia Complications Negative for: history of anesthetic complications  Airway Mallampati: III  TM Distance: >3 FB Neck ROM: Full    Dental no notable dental hx.    Pulmonary asthma , neg sleep apnea, COPD,  COPD inhaler,    breath sounds clear to auscultation- rhonchi (-) wheezing      Cardiovascular hypertension, Pt. on medications (-) angina(-) CAD, (-) Past MI and (-) Cardiac Stents  Rhythm:Regular Rate:Normal - Systolic murmurs and - Diastolic murmurs    Neuro/Psych neg Seizures negative neurological ROS  negative psych ROS   GI/Hepatic Neg liver ROS, GERD  ,  Endo/Other  negative endocrine ROSneg diabetes  Renal/GU negative Renal ROS     Musculoskeletal  (+) Arthritis ,   Abdominal (+) - obese,   Peds  Hematology negative hematology ROS (+)   Anesthesia Other Findings Past Medical History: No date: Arthritis No date: Asthma     Comment:  Ashtma without status asthmaticus No date: COPD (chronic obstructive pulmonary disease) ([facility]) No date: Dyspnea No date: Esophageal stricture No date: Fatty liver No date: GERD (gastroesophageal reflux disease) No date: Hypertension No date: Lipids serum increased No date: Obesity No date: Osteoporosis No date: Scoliosis No date: Trigger finger, acquired No date: Vertigo     Comment:  no episodes in over 1 yr No date: Vitamin D deficiency   Reproductive/Obstetrics                             Anesthesia Physical Anesthesia Plan  ASA: III  Anesthesia Plan: General   Post-op Pain Management:    Induction: Intravenous  PONV Risk Score and Plan: 2 and Propofol infusion  Airway Management Planned: Natural Airway  Additional  Equipment:   Intra-op Plan:   Post-operative Plan:   Informed Consent: I have reviewed the patients History and Physical, chart, labs and discussed the procedure including the risks, benefits and alternatives for the proposed anesthesia with the patient or authorized representative who has indicated his/her understanding and acceptance.     Dental advisory given  Plan Discussed with: CRNA and Anesthesiologist  Anesthesia Plan Comments:         Anesthesia Quick Evaluation

## 2020-12-16 NOTE — Op Note (Signed)
[facility] Gastroenterology Patient Name: Danita Proud Procedure Date: 12/16/2020 9:52 AM MRN: [id number] Account #: [id number] Date of Birth: 18-Feb-1941 Admit Type: Outpatient Age: 80 Room: [facility] ENDO ROOM 2 Gender: Female Note Status: Finalized Procedure:             Colonoscopy Indications:           High risk colon cancer surveillance: Personal history                         of multiple (3 or more) adenomas Providers:             Lorie Apley K. Parley Pidcock MD, MD Medicines:             Propofol per Anesthesia Complications:         No immediate complications. Procedure:             Pre-Anesthesia Assessment:                        - The risks and benefits of the procedure and the                         sedation options and risks were discussed with the                         patient. All questions were answered and informed                         consent was obtained.                        - The risks and benefits of the procedure and the                         sedation options and risks were discussed with the                         patient. All questions were answered and informed                         consent was obtained.                        - Patient identification and proposed procedure were                         verified prior to the procedure by the nurse. The                         procedure was verified in the procedure room.                        - ASA Grade Assessment: III - A patient with severe                         systemic disease.                        - After reviewing the risks and benefits, the patient  was deemed in satisfactory condition to undergo the                         procedure.                        After obtaining informed consent, the colonoscope was                         passed under direct vision. Throughout the procedure,                         the patient's blood pressure, pulse, and  oxygen                         saturations were monitored continuously. The                         Colonoscope was introduced through the anus and                         advanced to the the cecum, identified by appendiceal                         orifice and ileocecal valve. The colonoscopy was                         performed without difficulty. The patient tolerated                         the procedure well. The quality of the bowel                         preparation was adequate. The ileocecal valve,                         appendiceal orifice, and rectum were photographed. Findings:      The perianal and digital rectal examinations were normal. Pertinent       negatives include normal sphincter tone and no palpable rectal lesions.      Non-bleeding internal hemorrhoids were found during retroflexion. The       hemorrhoids were Grade I (internal hemorrhoids that do not prolapse).      A 5 mm polyp was found in the cecum. The polyp was sessile. The polyp       was removed with a jumbo cold forceps. Resection and retrieval were       complete.      The exam was otherwise without abnormality. Impression:            - Non-bleeding internal hemorrhoids.                        - One 5 mm polyp in the cecum, removed with a jumbo                         cold forceps. Resected and retrieved.                        - The examination was otherwise normal. Recommendation:        -  Patient has a contact number available for                         emergencies. The signs and symptoms of potential                         delayed complications were discussed with the patient.                         Return to normal activities tomorrow. Written                         discharge instructions were provided to the patient.                        - Resume previous diet.                        - Continue present medications.                        - If polyps are benign or adenomatous without                          dysplasia, I will advise NO further colonoscopy due to                         advanced age and/or severe comorbidity.                        - Return to GI office PRN.                        - The findings and recommendations were discussed with                         the patient. Procedure Code(s):     --- Professional ---                        [phone], Colonoscopy, flexible; with biopsy, single or                         multiple Diagnosis Code(s):     --- Professional ---                        K64.0, First degree hemorrhoids                        K63.5, Polyp of colon                        Z86.010, Personal history of colonic polyps CPT copyright 2019 [employer]. All rights reserved. The codes documented in this report are preliminary and upon coder review may  be revised to meet current compliance requirements. Efrain Sella MD, MD 12/16/2020 10:24:36 AM This report has been signed electronically. Number of Addenda: 0 Note Initiated On: 12/16/2020 9:52 AM Scope Withdrawal Time: 0 hours 8 minutes 57 seconds  Total Procedure Duration: 0 hours 13 minutes 13 seconds  Estimated Blood Loss:  Estimated blood  loss: none.      [facility]

## 2020-12-16 NOTE — Anesthesia Postprocedure Evaluation (Signed)
Anesthesia Post Note  Patient: Janet Rios  Procedure(s) Performed: COLONOSCOPY WITH PROPOFOL (N/A )  Patient location during evaluation: Endoscopy Anesthesia Type: General Level of consciousness: awake and alert and oriented Pain management: pain level controlled Vital Signs Assessment: post-procedure vital signs reviewed and stable Respiratory status: spontaneous breathing, nonlabored ventilation and respiratory function stable Cardiovascular status: blood pressure returned to baseline and stable Postop Assessment: no signs of nausea or vomiting Anesthetic complications: no   No complications documented.   Last Vitals:  Vitals:   12/16/20 1034 12/16/20 1044  BP: 129/75 (!) 136/96  Pulse:    Resp:    Temp:    SpO2:      Last Pain:  Vitals:   12/16/20 1044  TempSrc:   PainSc: 0-No pain                 Ragan Duhon

## 2020-12-16 NOTE — Interval H&P Note (Signed)
History and Physical Interval Note:  12/16/2020 9:55 AM  Janet Rios  has presented today for surgery, with the diagnosis of PERSONAL HX.OF COLON POLYPS.  The various methods of treatment have been discussed with the patient and family. After consideration of risks, benefits and other options for treatment, the patient has consented to  Procedure(s): COLONOSCOPY WITH PROPOFOL (N/A) as a surgical intervention.  The patient's history has been reviewed, patient examined, no change in status, stable for surgery.  I have reviewed the patient's chart and labs.  Questions were answered to the patient's satisfaction.     [place], [place]

## 2020-12-17 ENCOUNTER — Encounter: Payer: Self-pay | Admitting: Internal Medicine

## 2020-12-17 LAB — SURGICAL PATHOLOGY

## 2021-01-11 ENCOUNTER — Other Ambulatory Visit: Payer: Self-pay | Admitting: Internal Medicine

## 2021-01-11 DIAGNOSIS — Z1231 Encounter for screening mammogram for malignant neoplasm of breast: Secondary | ICD-10-CM

## 2021-03-15 ENCOUNTER — Other Ambulatory Visit: Payer: Self-pay

## 2021-03-15 ENCOUNTER — Ambulatory Visit
Admission: RE | Admit: 2021-03-15 | Discharge: 2021-03-15 | Disposition: A | Discharge status: Home / Self Care | Payer: Medicare Other | Source: Ambulatory Visit | Attending: Internal Medicine | Admitting: Internal Medicine

## 2021-03-15 DIAGNOSIS — Z1231 Encounter for screening mammogram for malignant neoplasm of breast: Secondary | ICD-10-CM | POA: Insufficient documentation

## 2021-11-17 ENCOUNTER — Other Ambulatory Visit: Payer: Self-pay | Admitting: Internal Medicine

## 2021-11-17 DIAGNOSIS — Z1231 Encounter for screening mammogram for malignant neoplasm of breast: Secondary | ICD-10-CM

## 2022-03-21 ENCOUNTER — Ambulatory Visit
Admission: RE | Admit: 2022-03-21 | Discharge: 2022-03-21 | Disposition: A | Discharge status: Home / Self Care | Payer: Medicare Other | Source: Ambulatory Visit | Attending: Internal Medicine | Admitting: Internal Medicine

## 2022-03-21 DIAGNOSIS — Z1231 Encounter for screening mammogram for malignant neoplasm of breast: Secondary | ICD-10-CM | POA: Diagnosis present

## 2022-12-06 ENCOUNTER — Other Ambulatory Visit: Payer: Self-pay

## 2022-12-06 DIAGNOSIS — Z1231 Encounter for screening mammogram for malignant neoplasm of breast: Secondary | ICD-10-CM

## 2023-03-17 ENCOUNTER — Other Ambulatory Visit: Payer: Self-pay | Admitting: Internal Medicine

## 2023-03-17 DIAGNOSIS — N63 Unspecified lump in unspecified breast: Secondary | ICD-10-CM

## 2023-03-21 ENCOUNTER — Other Ambulatory Visit: Payer: Self-pay | Admitting: Internal Medicine

## 2023-03-21 DIAGNOSIS — I712 Thoracic aortic aneurysm, without rupture, unspecified: Secondary | ICD-10-CM

## 2023-03-27 ENCOUNTER — Ambulatory Visit
Admission: RE | Admit: 2023-03-27 | Discharge: 2023-03-27 | Disposition: A | Discharge status: Home / Self Care | Payer: Medicare Other | Source: Ambulatory Visit | Attending: Internal Medicine | Admitting: Internal Medicine

## 2023-03-27 ENCOUNTER — Other Ambulatory Visit: Payer: Self-pay | Admitting: Internal Medicine

## 2023-03-27 DIAGNOSIS — I712 Thoracic aortic aneurysm, without rupture, unspecified: Secondary | ICD-10-CM | POA: Diagnosis not present

## 2023-03-27 DIAGNOSIS — Z1239 Encounter for other screening for malignant neoplasm of breast: Secondary | ICD-10-CM | POA: Insufficient documentation

## 2023-03-27 DIAGNOSIS — N63 Unspecified lump in unspecified breast: Secondary | ICD-10-CM | POA: Insufficient documentation

## 2023-03-27 DIAGNOSIS — N6311 Unspecified lump in the right breast, upper outer quadrant: Secondary | ICD-10-CM | POA: Insufficient documentation

## 2023-03-27 DIAGNOSIS — R92333 Mammographic heterogeneous density, bilateral breasts: Secondary | ICD-10-CM | POA: Diagnosis not present

## 2023-03-28 ENCOUNTER — Ambulatory Visit
Admission: RE | Admit: 2023-03-28 | Discharge: 2023-03-28 | Disposition: A | Discharge status: Home / Self Care | Payer: Medicare Other | Source: Ambulatory Visit | Attending: Internal Medicine | Admitting: Internal Medicine

## 2023-03-28 DIAGNOSIS — Z1239 Encounter for other screening for malignant neoplasm of breast: Secondary | ICD-10-CM | POA: Diagnosis not present

## 2023-03-28 DIAGNOSIS — I712 Thoracic aortic aneurysm, without rupture, unspecified: Secondary | ICD-10-CM

## 2023-03-28 MED ORDER — IOHEXOL 300 MG/ML  SOLN
80.0000 mL | Freq: Once | INTRAMUSCULAR | Status: AC | PRN
  Administered 2023-03-28: 80 mL via INTRAVENOUS

## 2023-04-11 ENCOUNTER — Emergency Department: Payer: Medicare Other

## 2023-04-11 ENCOUNTER — Other Ambulatory Visit: Payer: Self-pay

## 2023-04-11 ENCOUNTER — Inpatient Hospital Stay
Admission: EM | Admit: 2023-04-11 | Discharge: 2023-04-14 | DRG: 418 | Disposition: A | Discharge status: Home / Self Care | Payer: Medicare Other | Attending: Internal Medicine | Admitting: Internal Medicine

## 2023-04-11 DIAGNOSIS — Z6832 Body mass index (BMI) 32.0-32.9, adult: Secondary | ICD-10-CM | POA: Diagnosis not present

## 2023-04-11 DIAGNOSIS — Z79899 Other long term (current) drug therapy: Secondary | ICD-10-CM

## 2023-04-11 DIAGNOSIS — I10 Essential (primary) hypertension: Secondary | ICD-10-CM | POA: Diagnosis present

## 2023-04-11 DIAGNOSIS — Z888 Allergy status to other drugs, medicaments and biological substances status: Secondary | ICD-10-CM | POA: Diagnosis not present

## 2023-04-11 DIAGNOSIS — J9811 Atelectasis: Secondary | ICD-10-CM | POA: Diagnosis present

## 2023-04-11 DIAGNOSIS — E669 Obesity, unspecified: Secondary | ICD-10-CM | POA: Diagnosis present

## 2023-04-11 DIAGNOSIS — Z880 Allergy status to penicillin: Secondary | ICD-10-CM

## 2023-04-11 DIAGNOSIS — R079 Chest pain, unspecified: Secondary | ICD-10-CM

## 2023-04-11 DIAGNOSIS — R1011 Right upper quadrant pain: Secondary | ICD-10-CM | POA: Diagnosis present

## 2023-04-11 DIAGNOSIS — M810 Age-related osteoporosis without current pathological fracture: Secondary | ICD-10-CM | POA: Diagnosis present

## 2023-04-11 DIAGNOSIS — Z803 Family history of malignant neoplasm of breast: Secondary | ICD-10-CM | POA: Diagnosis not present

## 2023-04-11 DIAGNOSIS — Z7982 Long term (current) use of aspirin: Secondary | ICD-10-CM

## 2023-04-11 DIAGNOSIS — M419 Scoliosis, unspecified: Secondary | ICD-10-CM | POA: Diagnosis present

## 2023-04-11 DIAGNOSIS — K8000 Calculus of gallbladder with acute cholecystitis without obstruction: Principal | ICD-10-CM | POA: Diagnosis present

## 2023-04-11 DIAGNOSIS — K660 Peritoneal adhesions (postprocedural) (postinfection): Secondary | ICD-10-CM | POA: Diagnosis present

## 2023-04-11 DIAGNOSIS — M1990 Unspecified osteoarthritis, unspecified site: Secondary | ICD-10-CM | POA: Diagnosis present

## 2023-04-11 DIAGNOSIS — K810 Acute cholecystitis: Secondary | ICD-10-CM | POA: Diagnosis not present

## 2023-04-11 DIAGNOSIS — Z7951 Long term (current) use of inhaled steroids: Secondary | ICD-10-CM

## 2023-04-11 DIAGNOSIS — J441 Chronic obstructive pulmonary disease with (acute) exacerbation: Secondary | ICD-10-CM | POA: Diagnosis present

## 2023-04-11 DIAGNOSIS — K219 Gastro-esophageal reflux disease without esophagitis: Secondary | ICD-10-CM | POA: Diagnosis present

## 2023-04-11 DIAGNOSIS — J449 Chronic obstructive pulmonary disease, unspecified: Secondary | ICD-10-CM

## 2023-04-11 DIAGNOSIS — Z9842 Cataract extraction status, left eye: Secondary | ICD-10-CM

## 2023-04-11 DIAGNOSIS — K760 Fatty (change of) liver, not elsewhere classified: Secondary | ICD-10-CM | POA: Diagnosis present

## 2023-04-11 LAB — CBC
HCT: 46.1 % — ABNORMAL HIGH (ref 36.0–46.0)
Hemoglobin: 15.4 g/dL — ABNORMAL HIGH (ref 12.0–15.0)
MCH: 30.6 pg (ref 26.0–34.0)
MCHC: 33.4 g/dL (ref 30.0–36.0)
MCV: 91.7 fL (ref 80.0–100.0)
Platelets: 324 10*3/uL (ref 150–400)
RBC: 5.03 MIL/uL (ref 3.87–5.11)
RDW: 12.4 % (ref 11.5–15.5)
WBC: 9.9 10*3/uL (ref 4.0–10.5)
nRBC: 0 % (ref 0.0–0.2)

## 2023-04-11 LAB — URINALYSIS, ROUTINE W REFLEX MICROSCOPIC
Bilirubin Urine: NEGATIVE
Glucose, UA: NEGATIVE mg/dL
Hgb urine dipstick: NEGATIVE
Ketones, ur: 5 mg/dL — AB
Leukocytes,Ua: NEGATIVE
Nitrite: NEGATIVE
Protein, ur: NEGATIVE mg/dL
Specific Gravity, Urine: 1.011 (ref 1.005–1.030)
pH: 7 (ref 5.0–8.0)

## 2023-04-11 LAB — COMPREHENSIVE METABOLIC PANEL
ALT: 58 U/L — ABNORMAL HIGH (ref 0–44)
AST: 115 U/L — ABNORMAL HIGH (ref 15–41)
Albumin: 4 g/dL (ref 3.5–5.0)
Alkaline Phosphatase: 94 U/L (ref 38–126)
Anion gap: 12 (ref 5–15)
BUN: 18 mg/dL (ref 8–23)
CO2: 24 mmol/L (ref 22–32)
Calcium: 9.7 mg/dL (ref 8.9–10.3)
Chloride: 100 mmol/L (ref 98–111)
Creatinine, Ser: 0.87 mg/dL (ref 0.44–1.00)
GFR, Estimated: >60 mL/min (ref >60)
Glucose, Bld: 126 mg/dL — ABNORMAL HIGH (ref 70–99)
Potassium: 3.8 mmol/L (ref 3.5–5.1)
Sodium: 136 mmol/L (ref 135–145)
Total Bilirubin: 1.5 mg/dL — ABNORMAL HIGH (ref 0.3–1.2)
Total Protein: 7.8 g/dL (ref 6.5–8.1)

## 2023-04-11 LAB — TROPONIN I (HIGH SENSITIVITY)
Troponin I (High Sensitivity): 5 ng/L (ref <18)
Troponin I (High Sensitivity): 5 ng/L (ref <18)

## 2023-04-11 LAB — LIPASE, BLOOD: Lipase: 33 U/L (ref 11–51)

## 2023-04-11 MED ORDER — METHYLPREDNISOLONE SODIUM SUCC 125 MG IJ SOLR
125.0000 mg | Freq: Once | INTRAMUSCULAR | Status: AC
  Administered 2023-04-11: 125 mg via INTRAVENOUS
  Filled 2023-04-11: qty 2

## 2023-04-11 MED ORDER — ONDANSETRON HCL 4 MG/2ML IJ SOLN: 4.0000 mg | Freq: Four times a day (QID) | INTRAMUSCULAR | Status: DC | PRN

## 2023-04-11 MED ORDER — HYDRALAZINE HCL 20 MG/ML IJ SOLN: 10.0000 mg | INTRAMUSCULAR | Status: DC | PRN

## 2023-04-11 MED ORDER — IPRATROPIUM-ALBUTEROL 0.5-2.5 (3) MG/3ML IN SOLN
3.0000 mL | Freq: Once | RESPIRATORY_TRACT | Status: AC
  Administered 2023-04-11: 3 mL via RESPIRATORY_TRACT
  Filled 2023-04-11: qty 3

## 2023-04-11 MED ORDER — SODIUM CHLORIDE 0.9 % IV BOLUS
1000.0000 mL | Freq: Once | INTRAVENOUS | Status: AC
  Administered 2023-04-11: 1000 mL via INTRAVENOUS

## 2023-04-11 MED ORDER — MORPHINE SULFATE (PF) 2 MG/ML IV SOLN
2.0000 mg | INTRAVENOUS | Status: DC | PRN
  Administered 2023-04-11: 2 mg via INTRAVENOUS
  Filled 2023-04-11: qty 1

## 2023-04-11 MED ORDER — ONDANSETRON HCL 4 MG PO TABS: 4.0000 mg | ORAL_TABLET | Freq: Four times a day (QID) | ORAL | Status: DC | PRN

## 2023-04-11 MED ORDER — FENTANYL CITRATE PF 50 MCG/ML IJ SOSY
50.0000 ug | PREFILLED_SYRINGE | Freq: Once | INTRAMUSCULAR | Status: AC
  Administered 2023-04-11: 50 ug via INTRAVENOUS
  Filled 2023-04-11: qty 1

## 2023-04-11 MED ORDER — ALBUTEROL SULFATE (2.5 MG/3ML) 0.083% IN NEBU
2.5000 mg | INHALATION_SOLUTION | RESPIRATORY_TRACT | Status: DC | PRN
  Administered 2023-04-13: 2.5 mg via RESPIRATORY_TRACT

## 2023-04-11 MED ORDER — SODIUM CHLORIDE 0.9 % IV SOLN
1.0000 g | Freq: Once | INTRAVENOUS | Status: AC
  Administered 2023-04-11: 1 g via INTRAVENOUS
  Filled 2023-04-11: qty 10

## 2023-04-11 MED ORDER — ACETAMINOPHEN 325 MG PO TABS
650.0000 mg | ORAL_TABLET | Freq: Four times a day (QID) | ORAL | Status: DC | PRN
  Administered 2023-04-12 – 2023-04-13 (×2): 650 mg via ORAL

## 2023-04-11 MED ORDER — SODIUM CHLORIDE 0.9 % IV SOLN
2.0000 g | INTRAVENOUS | Status: DC
  Administered 2023-04-12 – 2023-04-13 (×2): 2 g via INTRAVENOUS
  Filled 2023-04-11 (×3): qty 20

## 2023-04-11 MED ORDER — IPRATROPIUM-ALBUTEROL 0.5-2.5 (3) MG/3ML IN SOLN
3.0000 mL | Freq: Four times a day (QID) | RESPIRATORY_TRACT | Status: DC
  Administered 2023-04-12 – 2023-04-14 (×8): 3 mL via RESPIRATORY_TRACT
  Filled 2023-04-11 (×2): qty 3

## 2023-04-11 MED ORDER — SODIUM CHLORIDE 0.9 % IV SOLN: 1.0000 g | Freq: Once | INTRAVENOUS | Status: DC

## 2023-04-11 MED ORDER — LACTATED RINGERS IV SOLN: INTRAVENOUS | Status: DC

## 2023-04-11 MED ORDER — ONDANSETRON HCL 4 MG/2ML IJ SOLN
4.0000 mg | Freq: Once | INTRAMUSCULAR | Status: AC
  Administered 2023-04-11: 4 mg via INTRAVENOUS
  Filled 2023-04-11: qty 2

## 2023-04-11 MED ORDER — INDOCYANINE GREEN 25 MG IV SOLR
1.2500 mg | Freq: Once | INTRAVENOUS | Status: AC
  Administered 2023-04-12: 1.25 mg via INTRAVENOUS
  Filled 2023-04-11: qty 10

## 2023-04-11 MED ORDER — INDOCYANINE GREEN 25 MG IV SOLR
1.2500 mg | Freq: Once | INTRAVENOUS | Status: DC
  Filled 2023-04-11: qty 10

## 2023-04-11 MED ORDER — ACETAMINOPHEN 650 MG RE SUPP: 650.0000 mg | Freq: Four times a day (QID) | RECTAL | Status: DC | PRN

## 2023-04-11 MED ORDER — METRONIDAZOLE 500 MG/100ML IV SOLN
500.0000 mg | Freq: Two times a day (BID) | INTRAVENOUS | Status: DC
  Administered 2023-04-11 – 2023-04-14 (×5): 500 mg via INTRAVENOUS
  Filled 2023-04-11 (×6): qty 100

## 2023-04-11 NOTE — ED Notes (Signed)
2nd trop sent to ED and repeating EKG. Pt continues to moan. Pt is clammy to touch.

## 2023-04-11 NOTE — H&P (Signed)
History and Physical    Patient: Janet Rios:[id number] DOB: 02-19-1941 DOA: 04/11/2023 DOS: the patient was seen and examined on 04/11/2023 PCP: Danella Penton, MD  Patient coming from: Home  Chief Complaint:  Chief Complaint  Patient presents with   Abdominal Pain    HPI: Janet Rios is a 82 y.o. female with medical history significant for HTN, COPD who presents to the ED with a 1 day history of upper abdominal pain, bandlike across the upper abdomen, worse after eating, associated with nausea and a sensation of bloating.  She also complains of mild shortness of breath beyond her baseline and had brief chest tightness associated with the abdominal pain.  She denies fevers or chills.  She tried antacids without relief.  She was seen by her PCP at [facility] who sent her to the ED for further evaluation.  She was previously in her usual state of health. ED course and Data review: BP with systolic in the 170s and 180s with otherwise normal vitals. Labs with normal CBC. CMP significant for elevated LFTs with AST 115, ALT 58 and total bili 1.5.  Lipase normal. Troponin 5 UA unremarkable. EKG, personally viewed and interpreted showing NSR at 60 with no acute ST-T wave changes. Chest x-ray showing enlarged cardiopericardial cardial silhouette, basilar atelectasis and underinflation. Right upper quadrant ultrasound consistent with cholecystitis as follows "IMPRESSION: Dilated gallbladder with sludge and stones. Borderline wall thickness and there is a positive sonographic Murphy's sign. Possible acute cholecystitis. Please correlate with other clinical symptoms and if needed confirmatory HIDA scan could be considered as clinically appropriate.  Fatty liver infiltration"   The ED provider: Spoke with surgeon Dr. Tonna Boehringer who would like to take patient to the OR in the a.m.  Requested hospitalist admission due to patient's complaints of shortness of breath and chest  tightness. Patient was treated with DuoNebs and Solu-Medrol in the ED and was given a dose of ceftriaxone. Hospitalist consulted for admission.   Review of Systems: As mentioned in the history of present illness. All other systems reviewed and are negative.  Past Medical History:  Diagnosis Date   Arthritis    Asthma    Ashtma without status asthmaticus   COPD (chronic obstructive pulmonary disease) (HCC)    Dyspnea    Esophageal stricture    Fatty liver    GERD (gastroesophageal reflux disease)    Hypertension    Lipids serum increased    Obesity    Osteoporosis    Scoliosis    Trigger finger, acquired    Vertigo    no episodes in over 1 yr   Vitamin D deficiency    Past Surgical History:  Procedure Laterality Date   ADENTAMOUS POLYP     APPENDECTOMY     BREAST CYST ASPIRATION Left    CATARACT EXTRACTION W/PHACO Right 11/01/2017   Procedure: CATARACT EXTRACTION PHACO AND INTRAOCULAR LENS PLACEMENT (IOC) RIGHT;  Surgeon: Lockie Mola, MD;  Location: [facility];  Service: Ophthalmology;  Laterality: Right;   CATARACT EXTRACTION W/PHACO Left 01/10/2018   Procedure: CATARACT EXTRACTION PHACO AND INTRAOCULAR LENS PLACEMENT (IOC)  LEFT;  Surgeon: Lockie Mola, MD;  Location: [facility];  Service: Ophthalmology;  Laterality: Left;   COLON SURGERY     COLONOSCOPY     COLONOSCOPY WITH PROPOFOL N/A 07/31/2015   Procedure: COLONOSCOPY WITH PROPOFOL;  Surgeon: Scot Jun, MD;  Location[facility];  Service: Endoscopy;  Laterality: N/A;   COLONOSCOPY WITH PROPOFOL N/A  12/11/2015   Procedure: COLONOSCOPY WITH PROPOFOL;  Surgeon: Scot Jun, MD;  Location: [facility] ENDOSCOPY;  Service: Endoscopy;  Laterality: N/A;   COLONOSCOPY WITH PROPOFOL N/A 12/16/2020   Procedure: COLONOSCOPY WITH PROPOFOL;  Surgeon: Toledo, Boykin Nearing, MD;  Location: [facility] ENDOSCOPY;  Service: Gastroenterology;  Laterality: N/A;   ESOPHAGEAL DILITATION     HERNIA REPAIR      umbilical   rt ELBOW SURGERY     TUBAL LIGATION     UMBILICAL HERNIA REPAIR     Social History:  reports that she has never smoked. She has never used smokeless tobacco. She reports that she does not drink alcohol and does not use drugs.  Allergies  Allergen Reactions   Alendronate Other (See Comments)    Joint pain   Statins Other (See Comments)    Muscle pain   Amoxicillin Rash    Family History  Problem Relation Age of Onset   Breast cancer Maternal Aunt 76   Breast cancer Cousin     Prior to Admission medications   Medication Sig Start Date End Date Taking? Authorizing Provider  albuterol (PROVENTIL HFA;VENTOLIN HFA) 108 (90 Base) MCG/ACT inhaler Inhale 2 puffs into the lungs every 4 (four) hours as needed for wheezing or shortness of breath.    [provider]  aspirin 81 MG tablet Take 81 mg by mouth daily.    [provider]  budesonide-formoterol (SYMBICORT) 160-4.5 MCG/ACT inhaler Inhale 2 puffs into the lungs daily.    [provider]  Calcium Carbonate (CALCIUM 600 PO) Take by mouth daily.    [provider]  Cholecalciferol (VITAMIN D3) 2000 units capsule Take 2,000 Units by mouth daily.    [provider]  co-enzyme Q-10 30 MG capsule Take 100 mg by mouth 3 (three) times daily.    [provider]  esomeprazole (NEXIUM) 40 MG capsule Take 40 mg by mouth daily at 12 noon.    [provider]  fluticasone (FLONASE) 50 MCG/ACT nasal spray Place into both nostrils daily.    [provider]  folic acid (FOLVITE) 400 MCG tablet Take 800 mcg by mouth daily.    [provider]  levocetirizine (XYZAL) 5 MG tablet Take 5 mg by mouth every evening.    [provider]  lisinopril-hydrochlorothiazide (PRINZIDE,ZESTORETIC) 10-12.5 MG tablet Take 1 tablet by mouth 2 (two) times daily.    [provider]  metoprolol (LOPRESSOR) 50 MG tablet Take 50 mg by mouth 2 (two) times daily.     [provider]  montelukast (SINGULAIR) 10 MG tablet Take 10 mg by mouth at bedtime.    [provider]  pantoprazole (PROTONIX) 40 MG tablet Take 40 mg by mouth daily. Reported on 07/31/2015    [provider]  vitamin B-12 (CYANOCOBALAMIN) 250 MCG tablet Take 250 mcg by mouth daily.    [provider]  Vitamin D, Ergocalciferol, (DRISDOL) 50000 units CAPS capsule Take 50,000 Units by mouth every 7 (seven) days.    [provider]    Physical Exam: Vitals:   04/11/23 1525 04/11/23 1730 04/11/23 1830 04/11/23 1900  BP:  (!) 187/69 (!) 176/97 (!) 165/86  Pulse:  (!) 59 66 85  Resp:  19 20 (!) 25  Temp:      TempSrc:      SpO2:  96% 94% 93%  Weight: 77.6 kg     Height: 5\' 1"  (1.549 m)      Physical Exam Vitals and nursing  note reviewed.  Constitutional:      General: She is not in acute distress. HENT:     Head: Normocephalic and atraumatic.  Cardiovascular:     Rate and Rhythm: Normal rate and regular rhythm.     Heart sounds: Normal heart sounds.  Pulmonary:     Effort: Pulmonary effort is normal.     Breath sounds: Normal breath sounds.  Abdominal:     Palpations: Abdomen is soft.     Tenderness: There is abdominal tenderness in the right upper quadrant.  Neurological:     Mental Status: Mental status is at baseline.     Labs on Admission: I have personally reviewed following labs and imaging studies  CBC: Recent Labs  Lab 04/11/23 1527  WBC 9.9  HGB 15.4*  HCT 46.1*  MCV 91.7  PLT 324   Basic Metabolic Panel: Recent Labs  Lab 04/11/23 1527  NA 136  K 3.8  CL 100  CO2 24  GLUCOSE 126*  BUN 18  CREATININE 0.87  CALCIUM 9.7   GFR: Estimated Creatinine Clearance: 47.8 mL/min (by C-G formula based on SCr of 0.87 mg/dL). Liver Function Tests: Recent Labs  Lab 04/11/23 1527  AST 115*  ALT 58*  ALKPHOS 94  BILITOT 1.5*  PROT 7.8  ALBUMIN 4.0   Recent Labs  Lab 04/11/23 1527  LIPASE 33   No  results for input(s): "AMMONIA" in the last 168 hours. Coagulation Profile: No results for input(s): "INR", "PROTIME" in the last 168 hours. Cardiac Enzymes: No results for input(s): "CKTOTAL", "CKMB", "CKMBINDEX", "TROPONINI" in the last 168 hours. BNP (last 3 results) No results for input(s): "PROBNP" in the last 8760 hours. HbA1C: No results for input(s): "HGBA1C" in the last 72 hours. CBG: No results for input(s): "GLUCAP" in the last 168 hours. Lipid Profile: No results for input(s): "CHOL", "HDL", "LDLCALC", "TRIG", "CHOLHDL", "LDLDIRECT" in the last 72 hours. Thyroid Function Tests: No results for input(s): "TSH", "T4TOTAL", "FREET4", "T3FREE", "THYROIDAB" in the last 72 hours. Anemia Panel: No results for input(s): "VITAMINB12", "FOLATE", "FERRITIN", "TIBC", "IRON", "RETICCTPCT" in the last 72 hours. Urine analysis:    Component Value Date/Time   COLORURINE YELLOW (A) 04/11/2023 1937   APPEARANCEUR CLEAR (A) 04/11/2023 1937   LABSPEC 1.011 04/11/2023 1937   PHURINE 7.0 04/11/2023 1937   GLUCOSEU NEGATIVE 04/11/2023 1937   HGBUR NEGATIVE 04/11/2023 1937   BILIRUBINUR NEGATIVE 04/11/2023 1937   KETONESUR 5 (A) 04/11/2023 1937   PROTEINUR NEGATIVE 04/11/2023 1937   NITRITE NEGATIVE 04/11/2023 1937   LEUKOCYTESUR NEGATIVE 04/11/2023 1937    Radiological Exams on Admission: DG Chest Portable 1 View  Result Date: 04/11/2023 CLINICAL DATA:  Chest pain EXAM: PORTABLE CHEST 1 VIEW COMPARISON:  None Available. FINDINGS: Underinflation. Enlarged cardiopericardial silhouette with calcified aorta. Basilar atelectasis. Overlapping cardiac leads. Curvature and degenerative changes along the spine. Calcifications along the tracheobronchial tree. IMPRESSION: Enlarged cardiopericardial silhouette. Basilar atelectasis. Underinflation. Electronically Signed   By: Karen Kays M.D.   On: 04/11/2023 18:44   US ABDOMEN LIMITED RUQ (LIVER/GB)  Result Date: 04/11/2023 CLINICAL DATA:  Right  upper quadrant pain for 36 hours EXAM: ULTRASOUND ABDOMEN LIMITED RIGHT UPPER QUADRANT COMPARISON:  Chest CT scan with contrast 03/28/2023 FINDINGS: Gallbladder: Dilated gallbladder with sludge and stones. Borderline wall thickness at 3 mm. The sonographer does report pain when scanning of the gallbladder. No adjacent fluid. Common bile duct: Diameter: 8 mm, within normal limits for patient's age. Liver: Diffusely echogenic hepatic parenchyma consistent with fatty liver  infiltration. Portal vein is patent on color Doppler imaging with normal direction of blood flow towards the liver. Other: None. IMPRESSION: Dilated gallbladder with sludge and stones. Borderline wall thickness and there is a positive sonographic Murphy's sign. Possible acute cholecystitis. Please correlate with other clinical symptoms and if needed confirmatory HIDA scan could be considered as clinically appropriate. Fatty liver infiltration. Electronically Signed   By: Karen Kays M.D.   On: 04/11/2023 18:43     Data Reviewed: Relevant notes from primary care and specialist visits, past discharge summaries as available in EHR, including Care Everywhere. Prior diagnostic testing as pertinent to current admission diagnoses Updated medications and problem lists for reconciliation ED course, including vitals, labs, imaging, treatment and response to treatment Triage notes, nursing and pharmacy notes and ED provider's notes Notable results as noted in HPI   Assessment and Plan: * Acute cholecystitis Patient with postprandial right upper quadrant pain, nausea and ultrasound suggesting acute cholecystitis Surgery to take to the OR in the a.m. Keep n.p.o. Rocephin and Flagyl (patient has penicillin allergy) Pain control, antiemetics and IV hydration  COPD with acute exacerbation (HCC) Continue DuoNebs scheduled and as needed Patient received a dose of Solu-Medrol in the ED with given ongoing infection we will hold off on additional  steroids for now Suspect shortness of breath in part due to splinting  Chest pain Patient had chest tightness along with abdominal pain, suspecting referred pain Troponin negative x2 and EKG is nonacute No prior history of CAD Will continue to monitor  Benign essential hypertension BP elevated, suspecting in part related to pain IV hydralazine as needed while n.p.o.    DVT prophylaxis: SCD due to surgery in the am  Consults: surgery, Dr Tonna Boehringer  Advance Care Planning: full code  Family Communication: none  Disposition Plan: Back to previous home environment  Severity of Illness: The appropriate patient status for this patient is INPATIENT. Inpatient status is judged to be reasonable and necessary in order to provide the required intensity of service to ensure the patient's safety. The patient's presenting symptoms, physical exam findings, and initial radiographic and laboratory data in the context of their chronic comorbidities is felt to place them at high risk for further clinical deterioration. Furthermore, it is not anticipated that the patient will be medically stable for discharge from the hospital within 2 midnights of admission.   * I certify that at the point of admission it is my clinical judgment that the patient will require inpatient hospital care spanning beyond 2 midnights from the point of admission due to high intensity of service, high risk for further deterioration and high frequency of surveillance required.*  Author: Andris Baumann, MD 04/11/2023 8:37 PM  For on call review [email or web address].

## 2023-04-11 NOTE — Assessment & Plan Note (Addendum)
Patient had chest tightness along with abdominal pain, suspecting referred pain Troponin negative x2 and EKG is nonacute No prior history of CAD Will continue to monitor

## 2023-04-11 NOTE — Assessment & Plan Note (Signed)
Continue DuoNebs scheduled and as needed Patient received a dose of Solu-Medrol in the ED with given ongoing infection we will hold off on additional steroids for now Suspect shortness of breath in part due to splinting

## 2023-04-11 NOTE — ED Notes (Signed)
Trop added to blood sent from earlier. Pt states increased cp.

## 2023-04-11 NOTE — H&P (Signed)
Subjective:   CC: acute cholecysitits  HPI:  Janet Rios is a 82 y.o. female who is consulted by [facility] for evaluation of above cc.  Symptoms were first noted 2 days ago. Pain is dull, epigastric, RUQ.  Associated with fullness, exacerbated by nothing.     Past Medical History:  has a past medical history of Arthritis, Asthma, COPD (chronic obstructive pulmonary disease) (HCC), Dyspnea, Esophageal stricture, Fatty liver, GERD (gastroesophageal reflux disease), Hypertension, Lipids serum increased, Obesity, Osteoporosis, Scoliosis, Trigger finger, acquired, Vertigo, and Vitamin D deficiency.  Past Surgical History:  has a past surgical history that includes ADENTAMOUS POLYP; ESOPHAGEAL DILITATION; Umbilical hernia repair; rt ELBOW SURGERY; Colonoscopy; Colonoscopy with propofol (N/A, 07/31/2015); Hernia repair; Colon surgery; Appendectomy; Tubal ligation; Colonoscopy with propofol (N/A, 12/11/2015); Breast cyst aspiration (Left); Cataract extraction w/PHACO (Right, 11/01/2017); Cataract extraction w/PHACO (Left, 01/10/2018); and Colonoscopy with propofol (N/A, 12/16/2020).  Family History: family history includes Breast cancer in her cousin; Breast cancer (age of onset: 30) in her maternal aunt.  Social History:  reports that she has never smoked. She has never used smokeless tobacco. She reports that she does not drink alcohol and does not use drugs.  Current Medications:  Prior to Admission medications   Medication Sig Start Date End Date Taking? Authorizing Provider  albuterol (PROVENTIL HFA;VENTOLIN HFA) 108 (90 Base) MCG/ACT inhaler Inhale 2 puffs into the lungs every 4 (four) hours as needed for wheezing or shortness of breath.    [provider]  aspirin 81 MG tablet Take 81 mg by mouth daily.    [provider]  budesonide-formoterol (SYMBICORT) 160-4.5 MCG/ACT inhaler Inhale 2 puffs into the lungs daily.    [provider]  Calcium Carbonate (CALCIUM 600 PO)  Take by mouth daily.    [provider]  Cholecalciferol (VITAMIN D3) 2000 units capsule Take 2,000 Units by mouth daily.    [provider]  co-enzyme Q-10 30 MG capsule Take 100 mg by mouth 3 (three) times daily.    [provider]  esomeprazole (NEXIUM) 40 MG capsule Take 40 mg by mouth daily at 12 noon.    [provider]  fluticasone (FLONASE) 50 MCG/ACT nasal spray Place into both nostrils daily.    [provider]  folic acid (FOLVITE) 400 MCG tablet Take 800 mcg by mouth daily.    [provider]  levocetirizine (XYZAL) 5 MG tablet Take 5 mg by mouth every evening.    [provider]  lisinopril-hydrochlorothiazide (PRINZIDE,ZESTORETIC) 10-12.5 MG tablet Take 1 tablet by mouth 2 (two) times daily.    [provider]  metoprolol (LOPRESSOR) 50 MG tablet Take 50 mg by mouth 2 (two) times daily.    [provider]  montelukast (SINGULAIR) 10 MG tablet Take 10 mg by mouth at bedtime.    [provider]  pantoprazole (PROTONIX) 40 MG tablet Take 40 mg by mouth daily. Reported on 07/31/2015    [provider]  vitamin B-12 (CYANOCOBALAMIN) 250 MCG tablet Take 250 mcg by mouth daily.    [provider]  Vitamin D, Ergocalciferol, (DRISDOL) 50000 units CAPS capsule Take 50,000 Units by mouth every 7 (seven) days.    [provider]    Allergies:  Allergies as of 04/11/2023 - Review Complete 04/11/2023  Allergen Reaction Noted   Alendronate Other (See Comments) 07/30/2015   Statins Other (See Comments) 12/10/2015   Amoxicillin Rash 10/24/2017    ROS:  General: Denies weight loss, weight gain, fatigue, fevers,  chills, and night sweats. Eyes: Denies blurry vision, double vision, eye pain, itchy eyes, and tearing. Ears: Denies hearing loss, earache, and ringing in ears. Nose: Denies sinus pain, congestion, infections, runny nose, and nosebleeds. Mouth/throat: Denies hoarseness,  sore throat, bleeding gums, and difficulty swallowing. Heart: Denies chest pain, palpitations, racing heart, irregular heartbeat, leg pain or swelling, and decreased activity tolerance. Respiratory: Denies breathing difficulty, shortness of breath, wheezing, cough, and sputum. GI: Denies nausea, vomiting, constipation, diarrhea, and blood in stool. GU: Denies difficulty urinating, pain with urinating, urgency, frequency, blood in urine. Musculoskeletal: Denies joint stiffness, pain, swelling, muscle weakness. Skin: Denies rash, itching, mass, tumors, sores, and boils Neurologic: Denies headache, fainting, dizziness, seizures, numbness, and tingling. Psychiatric: Denies depression, anxiety, difficulty sleeping, and memory loss. Endocrine: Denies heat or cold intolerance, and increased thirst or urination. Blood/lymph: Denies easy bruising, and swollen glands     Objective:     BP (!) 165/86   Pulse 85   Temp 98 F (36.7 C) (Oral)   Resp (!) 25   Ht 5\' 1"  (1.549 m)   Wt 77.6 kg   SpO2 93%   BMI 32.31 kg/m    Constitutional :  alert, cooperative, appears stated age, and no distress  Lymphatics/Throat:  no asymmetry, masses, or scars  Respiratory:  clear to auscultation bilaterally  Cardiovascular:  regular rate and rhythm  Gastrointestinal: Soft, no guarding, TTP epigastric, RUQ, mild .   Musculoskeletal: Steady movement  Skin: Cool and moist  Psychiatric: Normal affect, non-agitated, not confused       LABS:     Latest Ref Rng & Units 04/11/2023    3:27 PM  CMP  Glucose 70 - 99 mg/dL 952   BUN 8 - 23 mg/dL 18   Creatinine 8.41 - 1.00 mg/dL 3.24   Sodium 401 - 027 mmol/L 136   Potassium 3.5 - 5.1 mmol/L 3.8   Chloride 98 - 111 mmol/L 100   CO2 22 - 32 mmol/L 24   Calcium 8.9 - 10.3 mg/dL 9.7   Total Protein 6.5 - 8.1 g/dL 7.8   Total Bilirubin 0.3 - 1.2 mg/dL 1.5   Alkaline Phos 38 - 126 U/L 94   AST 15 - 41 U/L 115   ALT 0 - 44 U/L 58       Latest Ref Rng &  Units 04/11/2023    3:27 PM  CBC  WBC 4.0 - 10.5 K/uL 9.9   Hemoglobin 12.0 - 15.0 g/dL 25.3   Hematocrit 66.4 - 46.0 % 46.1   Platelets 150 - 400 K/uL 324      RADS: CLINICAL DATA:  Right upper quadrant pain for 36 hours   EXAM: ULTRASOUND ABDOMEN LIMITED RIGHT UPPER QUADRANT   COMPARISON:  Chest CT scan with contrast 03/28/2023   FINDINGS: Gallbladder:   Dilated gallbladder with sludge and stones. Borderline wall thickness at 3 mm. The sonographer does report pain when scanning of the gallbladder. No adjacent fluid.   Common bile duct:   Diameter: 8 mm, within normal limits for patient's age.   Liver:   Diffusely echogenic hepatic parenchyma consistent with fatty liver infiltration. Portal vein is patent on color Doppler imaging with normal direction of blood flow towards the liver.   Other: None.   IMPRESSION: Dilated gallbladder with sludge and stones. Borderline wall thickness and there is a positive sonographic Murphy's sign. Possible acute cholecystitis. Please correlate with other clinical symptoms and if needed confirmatory HIDA scan could be considered as clinically  appropriate.   Fatty liver infiltration.     Electronically Signed   By: Karen Kays M.D.   On: 04/11/2023 18:43 Assessment:      Acute cholecystitis based on history and imaging above.  COPD  Plan:     Discussed the risk of surgery including post-op infxn, seroma, biloma, chronic pain, poor-delayed wound healing, retained gallstone, conversion to open procedure, post-op SBO or ileus, and need for additional procedures to address said risks.  The risks of general anesthetic including MI, CVA, sudden death or even reaction to anesthetic medications also discussed. Alternatives include continued observation.  Benefits include possible symptom relief, prevention of complications including acute cholecystitis, pancreatitis.  Typical post operative recovery of 3-5 days rest, continued  pain in area and incision sites, possible loose stools up to 4-6 weeks, also discussed.  The patient understands the risks, any and all questions were answered to the patient's satisfaction.  IV rocephin.  CLD until midnight.  To OR in am. Admit to hospitalist due to complaints of woresning shortness of breath, hx of COPD, advanced age.  Full code per verbal report  labs/images/medications/previous chart entries reviewed personally and relevant changes/updates noted above.

## 2023-04-11 NOTE — Assessment & Plan Note (Signed)
BP elevated, suspecting in part related to pain IV hydralazine as needed while n.p.o.

## 2023-04-11 NOTE — ED Triage Notes (Addendum)
Pt comes from [facility] with c/o CP, belly pain and indigestion.

## 2023-04-11 NOTE — Assessment & Plan Note (Addendum)
Patient with postprandial right upper quadrant pain, nausea and ultrasound suggesting acute cholecystitis Surgery to take to the OR in the a.m. Keep n.p.o. Rocephin and Flagyl (patient has penicillin allergy) Pain control, antiemetics and IV hydration

## 2023-04-11 NOTE — ED Triage Notes (Signed)
Pt sts that she has been having indigestion and abd pain. Pt sts that she feels a sense of fullness. Pt sts that she feels like she needs to vomit.

## 2023-04-11 NOTE — ED Provider Notes (Signed)
[facility] Provider Note    Event Date/Time   First MD Initiated Contact with Patient 04/11/23 1753     (approximate)  History   Chief Complaint: Abdominal Pain  HPI  NACONA DEEL is a 82 y.o. female with a past medical history of asthma, COPD, gastric reflux, hypertension, presents to the emergency department for abdominal pain and chest tightness.  According to the patient since yesterday she has been experiencing pain across the upper abdomen into the lower chest as well as a sensation of chest tightness but does state a history of COPD.  Patient denies any fever.  States she has been nauseated with several episodes of vomiting today.  No diarrhea.  No fever.  Describes her pain as moderate currently.  Physical Exam   Triage Vital Signs: ED Triage Vitals  Encounter Vitals Group     BP 04/11/23 1520 (!) 161/82     Systolic BP Percentile --      Diastolic BP Percentile --      Pulse Rate 04/11/23 1520 66     Resp 04/11/23 1524 18     Temp 04/11/23 1520 98 F (36.7 C)     Temp Source 04/11/23 1520 Oral     SpO2 04/11/23 1520 97 %     Weight 04/11/23 1525 171 lb (77.6 kg)     Height 04/11/23 1525 5\' 1"  (1.549 m)     Head Circumference --      Peak Flow --      Pain Score 04/11/23 1524 6     Pain Loc --      Pain Education --      Exclude from Growth Chart --     Most recent vital signs: Vitals:   04/11/23 1524 04/11/23 1730  BP:  (!) 187/69  Pulse:  (!) 59  Resp: 18 19  Temp:    SpO2:  96%    General: Awake, no distress.  CV:  Good peripheral perfusion.  Regular rate and rhythm  Resp:  Normal effort.  Equal breath sounds bilaterally.  Abd:  No distention.  Soft, mild diffuse abdominal tenderness with more moderate to significant right upper quadrant tenderness.  No rebound or guarding.  No chest wall tenderness.  ED Results / Procedures / Treatments   EKG  EKG viewed and interpreted by myself shows a normal sinus rhythm at 60 bpm  with a narrow QRS, normal axis and normal intervals, no concerning ST changes.  RADIOLOGY  Ultrasound shows sludge with gallbladder dilation and gallbladder wall thickening concerning for acute cholecystitis.   MEDICATIONS ORDERED IN ED: Medications  fentaNYL (SUBLIMAZE) injection 50 mcg (has no administration in time range)  sodium chloride 0.9 % bolus 1,000 mL (has no administration in time range)  ondansetron (ZOFRAN) injection 4 mg (has no administration in time range)     IMPRESSION / MDM / ASSESSMENT AND PLAN / ED COURSE  I reviewed the triage vital signs and the nursing notes.  Patient's presentation is most consistent with acute presentation with potential threat to life or bodily function.  Patient presents to the emergency department for 2 days of upper abdominal pain and chest pain.  Nausea vomiting several episodes today.  Patient's lab work has resulted showing a reassuring CBC with a normal white blood cell count, reassuringly negative troponin.  Patient's chemistry does show LFT elevation with an AST of 115 and a total bilirubin of 1.5.  Lipase is reassuringly normal.  Patient does  have diffuse tenderness on exam but much more so in the right upper quadrant concerning for biliary pathology.  Will obtain right upper quadrant ultrasound to further evaluate.  Will treat pain nausea and IV hydrate while awaiting results.  Patient agreeable to plan of care and workup.  Patient's ultrasound is concerning for acute cholecystitis.  I spoke to Dr. Tonna Boehringer of general surgery who will be down to see the patient.  We will start the patient on IV Rocephin.  FINAL CLINICAL IMPRESSION(S) / ED DIAGNOSES   Right upper quadrant abdominal pain Acute cholecystitis  Note:  This document was prepared using Dragon voice recognition software and may include unintentional dictation errors.   Minna Antis, MD 04/11/23 1857

## 2023-04-12 ENCOUNTER — Encounter: Payer: Self-pay | Admitting: Internal Medicine

## 2023-04-12 ENCOUNTER — Other Ambulatory Visit: Payer: Self-pay

## 2023-04-12 ENCOUNTER — Encounter
Admission: EM | Disposition: A | Discharge status: Home / Self Care | Payer: Self-pay | Source: Home / Self Care | Attending: Internal Medicine

## 2023-04-12 ENCOUNTER — Inpatient Hospital Stay: Payer: Medicare Other | Admitting: Anesthesiology

## 2023-04-12 DIAGNOSIS — J441 Chronic obstructive pulmonary disease with (acute) exacerbation: Secondary | ICD-10-CM | POA: Diagnosis not present

## 2023-04-12 DIAGNOSIS — I10 Essential (primary) hypertension: Secondary | ICD-10-CM

## 2023-04-12 DIAGNOSIS — K810 Acute cholecystitis: Secondary | ICD-10-CM

## 2023-04-12 DIAGNOSIS — R079 Chest pain, unspecified: Secondary | ICD-10-CM

## 2023-04-12 LAB — COMPREHENSIVE METABOLIC PANEL
ALT: 182 U/L — ABNORMAL HIGH (ref 0–44)
AST: 292 U/L — ABNORMAL HIGH (ref 15–41)
Albumin: 3.6 g/dL (ref 3.5–5.0)
Alkaline Phosphatase: 103 U/L (ref 38–126)
Anion gap: 9 (ref 5–15)
BUN: 21 mg/dL (ref 8–23)
CO2: 25 mmol/L (ref 22–32)
Calcium: 9.1 mg/dL (ref 8.9–10.3)
Chloride: 102 mmol/L (ref 98–111)
Creatinine, Ser: 0.96 mg/dL (ref 0.44–1.00)
GFR, Estimated: 59 mL/min — ABNORMAL LOW (ref >60)
Glucose, Bld: 205 mg/dL — ABNORMAL HIGH (ref 70–99)
Potassium: 3.9 mmol/L (ref 3.5–5.1)
Sodium: 136 mmol/L (ref 135–145)
Total Bilirubin: 3.5 mg/dL — ABNORMAL HIGH (ref 0.3–1.2)
Total Protein: 7.4 g/dL (ref 6.5–8.1)

## 2023-04-12 LAB — CBC
HCT: 40.2 % (ref 36.0–46.0)
Hemoglobin: 13.6 g/dL (ref 12.0–15.0)
MCH: 30.6 pg (ref 26.0–34.0)
MCHC: 33.8 g/dL (ref 30.0–36.0)
MCV: 90.5 fL (ref 80.0–100.0)
Platelets: 260 10*3/uL (ref 150–400)
RBC: 4.44 MIL/uL (ref 3.87–5.11)
RDW: 12.3 % (ref 11.5–15.5)
WBC: 16.2 10*3/uL — ABNORMAL HIGH (ref 4.0–10.5)
nRBC: 0 % (ref 0.0–0.2)

## 2023-04-12 SURGERY — CHOLECYSTECTOMY, ROBOT-ASSISTED, LAPAROSCOPIC
Anesthesia: General

## 2023-04-12 MED ORDER — ARFORMOTEROL TARTRATE 15 MCG/2ML IN NEBU
15.0000 ug | INHALATION_SOLUTION | Freq: Two times a day (BID) | RESPIRATORY_TRACT | Status: DC
  Administered 2023-04-12 – 2023-04-14 (×5): 15 ug via RESPIRATORY_TRACT
  Filled 2023-04-12 (×6): qty 2

## 2023-04-12 MED ORDER — LIDOCAINE HCL (PF) 2 % IJ SOLN
INTRAMUSCULAR | Status: AC
  Filled 2023-04-12: qty 5

## 2023-04-12 MED ORDER — OXYCODONE HCL 5 MG PO TABS
ORAL_TABLET | ORAL | Status: AC
  Filled 2023-04-12: qty 1

## 2023-04-12 MED ORDER — ACETAMINOPHEN 10 MG/ML IV SOLN
INTRAVENOUS | Status: AC
  Filled 2023-04-12: qty 100

## 2023-04-12 MED ORDER — BUDESONIDE 0.5 MG/2ML IN SUSP
0.5000 mg | Freq: Two times a day (BID) | RESPIRATORY_TRACT | Status: DC
  Administered 2023-04-12 – 2023-04-14 (×5): 0.5 mg via RESPIRATORY_TRACT
  Filled 2023-04-12 (×6): qty 2

## 2023-04-12 MED ORDER — FLUTICASONE PROPIONATE 50 MCG/ACT NA SUSP
2.0000 | Freq: Every day | NASAL | Status: DC
  Filled 2023-04-12: qty 16

## 2023-04-12 MED ORDER — ONDANSETRON HCL 4 MG/2ML IJ SOLN
INTRAMUSCULAR | Status: DC | PRN
  Administered 2023-04-12: 4 mg via INTRAVENOUS

## 2023-04-12 MED ORDER — ACETAMINOPHEN 10 MG/ML IV SOLN
INTRAVENOUS | Status: DC | PRN
  Administered 2023-04-12: 1000 mg via INTRAVENOUS

## 2023-04-12 MED ORDER — FENTANYL CITRATE (PF) 100 MCG/2ML IJ SOLN
INTRAMUSCULAR | Status: AC
  Filled 2023-04-12: qty 2

## 2023-04-12 MED ORDER — ROCURONIUM BROMIDE 10 MG/ML (PF) SYRINGE
PREFILLED_SYRINGE | INTRAVENOUS | Status: AC
  Filled 2023-04-12: qty 10

## 2023-04-12 MED ORDER — INDOCYANINE GREEN 25 MG IV SOLR
INTRAVENOUS | Status: AC
  Filled 2023-04-12: qty 10

## 2023-04-12 MED ORDER — ORAL CARE MOUTH RINSE: 15.0000 mL | Freq: Once | OROMUCOSAL | Status: AC

## 2023-04-12 MED ORDER — CHLORHEXIDINE GLUCONATE 0.12 % MT SOLN
OROMUCOSAL | Status: AC
  Filled 2023-04-12: qty 15

## 2023-04-12 MED ORDER — ACETAMINOPHEN 325 MG PO TABS
ORAL_TABLET | ORAL | Status: AC
  Filled 2023-04-12: qty 2

## 2023-04-12 MED ORDER — FENTANYL CITRATE (PF) 100 MCG/2ML IJ SOLN
25.0000 ug | INTRAMUSCULAR | Status: DC | PRN
  Administered 2023-04-12 (×3): 25 ug via INTRAVENOUS

## 2023-04-12 MED ORDER — ROCURONIUM BROMIDE 100 MG/10ML IV SOLN
INTRAVENOUS | Status: DC | PRN
  Administered 2023-04-12: 5 mg via INTRAVENOUS
  Administered 2023-04-12: 50 mg via INTRAVENOUS

## 2023-04-12 MED ORDER — LIDOCAINE HCL (CARDIAC) PF 100 MG/5ML IV SOSY
PREFILLED_SYRINGE | INTRAVENOUS | Status: DC | PRN
  Administered 2023-04-12: 60 mg via INTRAVENOUS

## 2023-04-12 MED ORDER — FENTANYL CITRATE (PF) 100 MCG/2ML IJ SOLN
INTRAMUSCULAR | Status: DC | PRN
  Administered 2023-04-12 (×3): 50 ug via INTRAVENOUS

## 2023-04-12 MED ORDER — DEXAMETHASONE SODIUM PHOSPHATE 10 MG/ML IJ SOLN
INTRAMUSCULAR | Status: AC
  Filled 2023-04-12: qty 1

## 2023-04-12 MED ORDER — PROPOFOL 10 MG/ML IV BOLUS
INTRAVENOUS | Status: AC
  Filled 2023-04-12: qty 20

## 2023-04-12 MED ORDER — ONDANSETRON HCL 4 MG/2ML IJ SOLN
INTRAMUSCULAR | Status: AC
  Filled 2023-04-12: qty 2

## 2023-04-12 MED ORDER — INFLUENZA VAC A&B SURF ANT ADJ 0.5 ML IM SUSY
0.5000 mL | PREFILLED_SYRINGE | INTRAMUSCULAR | Status: DC
  Filled 2023-04-12: qty 0.5

## 2023-04-12 MED ORDER — IPRATROPIUM-ALBUTEROL 0.5-2.5 (3) MG/3ML IN SOLN
RESPIRATORY_TRACT | Status: AC
  Filled 2023-04-12: qty 3

## 2023-04-12 MED ORDER — EPINEPHRINE PF 1 MG/ML IJ SOLN
INTRAMUSCULAR | Status: AC
  Filled 2023-04-12: qty 1

## 2023-04-12 MED ORDER — METOPROLOL SUCCINATE ER 25 MG PO TB24
25.0000 mg | ORAL_TABLET | Freq: Every evening | ORAL | Status: DC
  Administered 2023-04-12 – 2023-04-13 (×2): 25 mg via ORAL
  Filled 2023-04-12 (×3): qty 1

## 2023-04-12 MED ORDER — METOPROLOL SUCCINATE ER 25 MG PO TB24
100.0000 mg | ORAL_TABLET | Freq: Every morning | ORAL | Status: DC
  Administered 2023-04-13 – 2023-04-14 (×2): 100 mg via ORAL
  Filled 2023-04-12 (×2): qty 4

## 2023-04-12 MED ORDER — KETOROLAC TROMETHAMINE 30 MG/ML IJ SOLN
INTRAMUSCULAR | Status: DC | PRN
  Administered 2023-04-12: 15 mg via INTRAVENOUS

## 2023-04-12 MED ORDER — OXYCODONE HCL 5 MG PO TABS
5.0000 mg | ORAL_TABLET | Freq: Once | ORAL | Status: AC | PRN
  Administered 2023-04-12: 5 mg via ORAL

## 2023-04-12 MED ORDER — OXYCODONE HCL 5 MG/5ML PO SOLN: 5.0000 mg | Freq: Once | ORAL | Status: AC | PRN

## 2023-04-12 MED ORDER — PANTOPRAZOLE SODIUM 40 MG PO TBEC
40.0000 mg | DELAYED_RELEASE_TABLET | Freq: Every day | ORAL | Status: DC
  Administered 2023-04-12 – 2023-04-14 (×3): 40 mg via ORAL

## 2023-04-12 MED ORDER — SUGAMMADEX SODIUM 200 MG/2ML IV SOLN
INTRAVENOUS | Status: DC | PRN
  Administered 2023-04-12: 200 mg via INTRAVENOUS

## 2023-04-12 MED ORDER — DEXAMETHASONE SODIUM PHOSPHATE 10 MG/ML IJ SOLN
INTRAMUSCULAR | Status: DC | PRN
  Administered 2023-04-12: 10 mg via INTRAVENOUS

## 2023-04-12 MED ORDER — 0.9 % SODIUM CHLORIDE (POUR BTL) OPTIME
TOPICAL | Status: DC | PRN
  Administered 2023-04-12: 500 mL

## 2023-04-12 MED ORDER — BUPIVACAINE HCL (PF) 0.5 % IJ SOLN
INTRAMUSCULAR | Status: AC
  Filled 2023-04-12: qty 30

## 2023-04-12 MED ORDER — FLUTICASONE PROPIONATE 50 MCG/ACT NA SUSP
2.0000 | Freq: Every day | NASAL | Status: DC
  Administered 2023-04-12 – 2023-04-14 (×3): 2 via NASAL
  Filled 2023-04-12: qty 16

## 2023-04-12 MED ORDER — ENOXAPARIN SODIUM 40 MG/0.4ML IJ SOSY
40.0000 mg | PREFILLED_SYRINGE | INTRAMUSCULAR | Status: DC
  Administered 2023-04-13 – 2023-04-14 (×2): 40 mg via SUBCUTANEOUS

## 2023-04-12 MED ORDER — VITAMIN D3 25 MCG (1000 UNIT) PO TABS
2000.0000 [IU] | ORAL_TABLET | Freq: Every day | ORAL | Status: DC
  Administered 2023-04-12 – 2023-04-13 (×2): 2000 [IU] via ORAL
  Filled 2023-04-12 (×2): qty 2

## 2023-04-12 MED ORDER — PROPOFOL 10 MG/ML IV BOLUS
INTRAVENOUS | Status: DC | PRN
  Administered 2023-04-12: 90 mg via INTRAVENOUS
  Administered 2023-04-12: 30 mg via INTRAVENOUS

## 2023-04-12 MED ORDER — LORATADINE 10 MG PO TABS
ORAL_TABLET | ORAL | Status: AC
  Filled 2023-04-12: qty 1

## 2023-04-12 MED ORDER — ARTIFICIAL TEARS OPHTHALMIC OINT
TOPICAL_OINTMENT | OPHTHALMIC | Status: AC
  Filled 2023-04-12: qty 3.5

## 2023-04-12 MED ORDER — LACTATED RINGERS IV SOLN: INTRAVENOUS | Status: DC

## 2023-04-12 MED ORDER — PANTOPRAZOLE SODIUM 40 MG PO TBEC
DELAYED_RELEASE_TABLET | ORAL | Status: AC
  Filled 2023-04-12: qty 1

## 2023-04-12 MED ORDER — FOLIC ACID 1 MG PO TABS
1000.0000 ug | ORAL_TABLET | Freq: Every day | ORAL | Status: DC
  Administered 2023-04-12 – 2023-04-13 (×2): 1 mg via ORAL
  Filled 2023-04-12 (×2): qty 1

## 2023-04-12 MED ORDER — BUPIVACAINE-EPINEPHRINE 0.5% -1:200000 IJ SOLN
INTRAMUSCULAR | Status: DC | PRN
Start: 2023-04-12 — End: 2023-04-12
  Administered 2023-04-12: 25 mL

## 2023-04-12 MED ORDER — LORATADINE 10 MG PO TABS
10.0000 mg | ORAL_TABLET | Freq: Every day | ORAL | Status: DC
  Administered 2023-04-12 – 2023-04-14 (×3): 10 mg via ORAL

## 2023-04-12 MED ORDER — CHLORHEXIDINE GLUCONATE 0.12 % MT SOLN
15.0000 mL | Freq: Once | OROMUCOSAL | Status: AC
  Administered 2023-04-12: 15 mL via OROMUCOSAL

## 2023-04-12 SURGICAL SUPPLY — 51 items
ADH SKN CLS APL DERMABOND .7 (GAUZE/BANDAGES/DRESSINGS) ×1
ANCHOR TIS RET SYS 235ML (MISCELLANEOUS) ×1 IMPLANT
BAG PRESSURE INF REUSE 1000 (BAG) IMPLANT
BAG TISS RTRVL C235 10X14 (MISCELLANEOUS) ×1
BLADE SURG SZ11 CARB STEEL (BLADE) ×1 IMPLANT
CATH REDDICK CHOLANGI 4FR 50CM (CATHETERS) IMPLANT
CAUTERY HOOK MNPLR 1.6 DVNC XI (INSTRUMENTS) ×1 IMPLANT
CLIP LIGATING HEMO O LOK GREEN (MISCELLANEOUS) ×1 IMPLANT
DERMABOND ADVANCED .7 DNX12 (GAUZE/BANDAGES/DRESSINGS) ×1 IMPLANT
DRAPE ARM DVNC X/XI (DISPOSABLE) ×4 IMPLANT
DRAPE C-ARM XRAY 36X54 (DRAPES) IMPLANT
DRAPE COLUMN DVNC XI (DISPOSABLE) ×1 IMPLANT
ELECT CAUTERY BLADE 6.4 (BLADE) ×1 IMPLANT
ELECT REM PT RETURN 9FT ADLT (ELECTROSURGICAL) ×1
ELECTRODE REM PT RTRN 9FT ADLT (ELECTROSURGICAL) ×1 IMPLANT
FORCEPS BPLR FENES DVNC XI (FORCEP) ×1 IMPLANT
FORCEPS PROGRASP DVNC XI (FORCEP) ×1 IMPLANT
GLOVE BIOGEL PI IND STRL 7.0 (GLOVE) ×2 IMPLANT
GLOVE SURG SYN 6.5 ES PF (GLOVE) ×2 IMPLANT
GLOVE SURG SYN 6.5 PF PI (GLOVE) ×2 IMPLANT
GOWN STRL REUS W/ TWL LRG LVL3 (GOWN DISPOSABLE) ×3 IMPLANT
GOWN STRL REUS W/TWL LRG LVL3 (GOWN DISPOSABLE) ×3
GRASPER SUT TROCAR 14GX15 (MISCELLANEOUS) IMPLANT
IRRIGATOR SUCT 8 DISP DVNC XI (IRRIGATION / IRRIGATOR) IMPLANT
IV NS 1000ML (IV SOLUTION)
IV NS 1000ML BAXH (IV SOLUTION) IMPLANT
KIT TURNOVER KIT A (KITS) ×1 IMPLANT
LABEL OR SOLS (LABEL) ×1 IMPLANT
MANIFOLD NEPTUNE II (INSTRUMENTS) ×1 IMPLANT
NDL HYPO 22X1.5 SAFETY MO (MISCELLANEOUS) ×1 IMPLANT
NDL INSUFFLATION 14GA 120MM (NEEDLE) ×1 IMPLANT
NEEDLE HYPO 22X1.5 SAFETY MO (MISCELLANEOUS) ×1 IMPLANT
NEEDLE INSUFFLATION 14GA 120MM (NEEDLE) ×1 IMPLANT
NS IRRIG 500ML POUR BTL (IV SOLUTION) ×1 IMPLANT
OBTURATOR OPTICAL STND 8 DVNC (TROCAR) ×1
OBTURATOR OPTICALSTD 8 DVNC (TROCAR) ×1 IMPLANT
PACK LAP CHOLECYSTECTOMY (MISCELLANEOUS) ×1 IMPLANT
PENCIL SMOKE EVACUATOR (MISCELLANEOUS) ×1 IMPLANT
SEAL UNIV 5-12 XI (MISCELLANEOUS) ×4 IMPLANT
SET TUBE SMOKE EVAC HIGH FLOW (TUBING) ×1 IMPLANT
SOL ELECTROSURG ANTI STICK (MISCELLANEOUS) ×1
SOLUTION ELECTROSURG ANTI STCK (MISCELLANEOUS) ×1 IMPLANT
SPIKE FLUID TRANSFER (MISCELLANEOUS) ×2 IMPLANT
SUT MNCRL 4-0 (SUTURE) ×2
SUT MNCRL 4-0 27XMFL (SUTURE) ×2
SUT VICRYL 0 UR6 27IN ABS (SUTURE) ×1 IMPLANT
SUTURE MNCRL 4-0 27XMF (SUTURE) ×2 IMPLANT
SYR 30ML LL (SYRINGE) IMPLANT
SYSTEM WECK SHIELD CLOSURE (TROCAR) IMPLANT
TRAP FLUID SMOKE EVACUATOR (MISCELLANEOUS) ×1 IMPLANT
WATER STERILE IRR 500ML POUR (IV SOLUTION) ×1 IMPLANT

## 2023-04-12 NOTE — Op Note (Signed)
Preoperative diagnosis:  acute and cholecystitis  Postoperative diagnosis: same as above  Procedure: Robotic assisted Laparoscopic Cholecystectomy.   Anesthesia: GETA   Surgeon: Sung Amabile  Specimen: Gallbladder  Complications: None  EBL: 15mL  Wound Classification: Clean Contaminated  Indications: see HPI  Findings: Critical view of safety noted Cystic duct and artery identified, ligated and divided, clips remained intact at end of procedure Adequate hemostasis  Description of procedure:  The patient was placed on the operating table in the supine position. SCDs placed, pre-op abx administered.  General anesthesia was induced and OG tube placed by anesthesia. A time-out was completed verifying correct patient, procedure, site, positioning, and implant(s) and/or special equipment prior to beginning this procedure. The abdomen was prepped and draped in the usual sterile fashion.    Veress needle was placed at the Palmer's point and insufflation was started after confirming a positive saline drop test and no immediate increase in abdominal pressure.  After reaching 15 mm, the Veress needle was removed and a 5mm port placed in same area via optiview technique.  The abdomen was inspected and no abnormalities or injuries were found.  Additional ports placed in following areas: 8mm port Under umbilicus measured 20mm from gallbladder,One 12 mm patient left of the umbilicus. one 8 mm port placed to the patient right of the umbilical port 8 cm apart.  1 additional 8 mm port placed lateral to the 12mm port.  Once ports were placed, The table was placed in the reverse Trendelenburg position with the right side up. The Xi platform was brought into the operative field and docked to the ports successfully.  An endoscope was placed through the umbilical port, fenestrated grasper through the adjacent patient right port, prograsp to the far patient left port, and then a hook cautery in the left  port.  The dome of the gallbladder was grasped with prograsp, passed and retracted over the dome of the liver. Adhesions between the gallbladder and omentum, duodenum and transverse colon were lysed via hook cautery. The infundibulum was grasped with the fenestrated grasper and retracted toward the right lower quadrant. This maneuver exposed Calot's triangle. The peritoneum overlying the gallbladder infundibulum was then dissected  and the cystic duct and cystic artery identified.  Critical view of safety with the liver bed clearly visible behind the duct and artery with no additional structures noted.  The cystic duct and cystic artery clipped and divided close to the gallbladder.     The gallbladder was then dissected from its peritoneal and liver bed attachments by electrocautery. Hemostasis was checked prior to removing the hook cautery and the Endo Catch bag was then placed through the 12 mm port and the gallbladder was removed.  The gallbladder was passed off the table as a specimen. There was no evidence of bleeding from the gallbladder fossa or cystic artery or leakage of the bile from the cystic duct stump. The 12 mm port site closed with PMI using 0 vicryl under direct vision.  Abdomen desufflated and secondary trocars were removed under direct vision. No bleeding was noted. All skin incisions then closed with subcuticular sutures of 4-0 monocryl and dressed with topical skin adhesive. The orogastric tube was removed and patient extubated.  The patient tolerated the procedure well and was taken to the postanesthesia care unit in stable condition.  All sponge and instrument count correct at end of procedure.

## 2023-04-12 NOTE — Transfer of Care (Signed)
Immediate Anesthesia Transfer of Care Note  Patient: Janet Rios  Procedure(s) Performed: XI ROBOTIC ASSISTED LAPAROSCOPIC CHOLECYSTECTOMY  Patient Location: PACU  Anesthesia Type:General  Level of Consciousness: awake, alert , and oriented  Airway & Oxygen Therapy: Patient Spontanous Breathing  Post-op Assessment: Report given to RN and Post -op Vital signs reviewed and stable  Post vital signs: Reviewed and stable  Last Vitals:  Vitals Value Taken Time  BP 155/59 04/12/23 1050  Temp 97.28f   Pulse 69 04/12/23 1053  Resp 13 04/12/23 1053  SpO2 100 % 04/12/23 1053  Vitals shown include unfiled device data.  Last Pain:  Vitals:   04/12/23 0824  TempSrc:   PainSc: 0-No pain         Complications: No notable events documented.

## 2023-04-12 NOTE — Anesthesia Preprocedure Evaluation (Addendum)
Anesthesia Evaluation  Patient identified by MRN, date of birth, ID band Patient awake    Reviewed: Allergy & Precautions, NPO status , Patient's Chart, lab work & pertinent test results  History of Anesthesia Complications Negative for: history of anesthetic complications  Airway Mallampati: III  TM Distance: <3 FB Neck ROM: full    Dental  (+) Chipped   Pulmonary shortness of breath, asthma , COPD,  COPD inhaler   Pulmonary exam normal        Cardiovascular hypertension, (-) angina (-) Past MI Normal cardiovascular exam     Neuro/Psych negative neurological ROS  negative psych ROS   GI/Hepatic Neg liver ROS,GERD  Controlled,,  Endo/Other  negative endocrine ROS    Renal/GU      Musculoskeletal   Abdominal   Peds  Hematology negative hematology ROS (+)   Anesthesia Other Findings Past Medical History: No date: Arthritis No date: Asthma     Comment:  Ashtma without status asthmaticus No date: COPD (chronic obstructive pulmonary disease) (HCC) No date: Dyspnea No date: Esophageal stricture No date: Fatty liver No date: GERD (gastroesophageal reflux disease) No date: Hypertension No date: Lipids serum increased No date: Obesity No date: Osteoporosis No date: Scoliosis No date: Trigger finger, acquired No date: Vertigo     Comment:  no episodes in over 1 yr No date: Vitamin D deficiency  Past Surgical History: No date: ADENTAMOUS POLYP No date: APPENDECTOMY No date: BREAST CYST ASPIRATION; Left 11/01/2017: CATARACT EXTRACTION W/PHACO; Right     Comment:  Procedure: CATARACT EXTRACTION PHACO AND INTRAOCULAR               LENS PLACEMENT (IOC) RIGHT;  Surgeon: Lockie Mola, MD;  Location: [facility];  Service:               Ophthalmology;  Laterality: Right; 01/10/2018: CATARACT EXTRACTION W/PHACO; Left     Comment:  Procedure: CATARACT EXTRACTION PHACO AND INTRAOCULAR                LENS PLACEMENT (IOC)  LEFT;  Surgeon: Lockie Mola, MD;  Location: [facility];  Service:               Ophthalmology;  Laterality: Left; No date: COLON SURGERY No date: COLONOSCOPY 07/31/2015: COLONOSCOPY WITH PROPOFOL; N/A     Comment:  Procedure: COLONOSCOPY WITH PROPOFOL;  Surgeon: Scot Jun, MD;  Location: [facility];  Service:               Endoscopy;  Laterality: N/A; 12/11/2015: COLONOSCOPY WITH PROPOFOL; N/A     Comment:  Procedure: COLONOSCOPY WITH PROPOFOL;  Surgeon: Scot Jun, MD;  Location: [facility];  Service:               Endoscopy;  Laterality: N/A; 12/16/2020: COLONOSCOPY WITH PROPOFOL; N/A     Comment:  Procedure: COLONOSCOPY WITH PROPOFOL;  Surgeon: [place],               Boykin Nearing, MD;  Location: [facility] ENDOSCOPY;  Service:               Gastroenterology;  Laterality: N/A; No date: ESOPHAGEAL DILITATION No date:  HERNIA REPAIR     Comment:  umbilical No date: rt ELBOW SURGERY No date: TUBAL LIGATION No date: UMBILICAL HERNIA REPAIR  BMI    Body Mass Index: 32.31 kg/m      Reproductive/Obstetrics negative OB ROS                             Anesthesia Physical Anesthesia Plan  ASA: 3  Anesthesia Plan: General ETT   Post-op Pain Management:    Induction: Intravenous  PONV Risk Score and Plan: Ondansetron, Dexamethasone, Midazolam and Treatment may vary due to age or medical condition  Airway Management Planned: Oral ETT  Additional Equipment:   Intra-op Plan:   Post-operative Plan: Extubation in OR and Possible Post-op intubation/ventilation  Informed Consent: I have reviewed the patients History and Physical, chart, labs and discussed the procedure including the risks, benefits and alternatives for the proposed anesthesia with the patient or authorized representative who has indicated his/her understanding and acceptance.     Dental  Advisory Given  Plan Discussed with: Anesthesiologist, CRNA and Surgeon  Anesthesia Plan Comments: (Patient consented for risks of anesthesia including but not limited to:  - adverse reactions to medications - damage to eyes, teeth, lips or other oral mucosa - nerve damage due to positioning  - sore throat or hoarseness - Damage to heart, brain, nerves, lungs, other parts of body or loss of life  Patient voiced understanding.)       Anesthesia Quick Evaluation

## 2023-04-12 NOTE — Progress Notes (Signed)
PROGRESS NOTE    Janet Rios  ZOX:[id number] DOB: 1941-04-20 DOA: 04/11/2023 PCP: Danella Penton, MD    Chief Complaint  Patient presents with   Abdominal Pain    Brief Narrative:  Patient 82 year old female history of hypertension, COPD presented to the ED with a 1 day history of upper abdominal pain bandlike across upper abdomen worse with oral intake associated with nausea and sensation of bloating.  Patient also with complaints of mild shortness of breath beyond her baseline had some chest tightness associated with abdominal pain.  Patient seen by PCP at [facility] clinic sent to the ED for further evaluation.  Patient seen in the ED noted to have a transaminitis, right upper quadrant ultrasound done consistent with acute cholecystitis.  Patient seen in consultation by general surgery and requested hospitalist admission due to patient's complaints of shortness of breath and chest tightness.  Patient taken to the OR 04/12/2023 for laparoscopic cholecystectomy.   Assessment & Plan:   Principal Problem:   Acute cholecystitis Active Problems:   COPD with acute exacerbation (HCC)   Chest pain   Benign essential hypertension  #1 acute cholecystitis -Patient presented with upper abdominal pain postprandially x 1 day with nausea.- -Right upper quadrant ultrasound done on admission consistent with cholecystitis. -Lab work with a transaminitis on comprehensive metabolic profile. -Patient seen in consultation by general surgery and patient underwent laparoscopic cholecystectomy this morning 04/12/2023. -Continue IV antibiotics of Rocephin and Flagyl, IV fluids, IV antiemetics, supportive care. -Per general surgery.  2.  COPD with acute exacerbation -Patient noted to have received a dose of IV Solu-Medrol in the ED however with ongoing infection steroids currently on hold. -Continue scheduled DuoNebs. -Start Flonase, Brovana, Pulmicort, PPI.  3.  Hypertension -Pain likely contributing  to elevated blood pressures on admission. -Resume home regimen Toprol-XL. -Continue to hold lisinopril HCTZ. -Supportive care.  4.  Chest pain -Patient noted to have some chest tightness along with abdominal pain, likely referred pain secondary to acute cholecystitis. -Cardiac enzymes negative x 2.  EKG with no ischemic changes noted. -Patient with no prior history of CAD. -See numbers 1, 2.  DVT prophylaxis: SCDs Code Status: Full Family Communication: No family at bedside. Disposition: TBD  Status is: Inpatient Remains inpatient appropriate because: Severity of illness   Consultants:  General Surgery: Dr. Tonna Boehringer  Procedures:  Chest x-ray 04/11/2023 Right upper quadrant ultrasound 04/11/2023 Robotic assisted laparoscopic cholecystectomy:  General Surgery: Dr. Tonna Boehringer, 04/12/2023  Antimicrobials:  Anti-infectives (From admission, onward)    Start     Dose/Rate Route Frequency Ordered Stop   04/12/23 2100  cefTRIAXone (ROCEPHIN) 2 g in sodium chloride 0.9 % 100 mL IVPB       Placed in "Followed by" Linked Group   2 g 200 mL/hr over 30 Minutes Intravenous Every 24 hours 04/11/23 2054     04/11/23 2100  cefTRIAXone (ROCEPHIN) 1 g in sodium chloride 0.9 % 100 mL IVPB       Placed in "Followed by" Linked Group   1 g 200 mL/hr over 30 Minutes Intravenous  Once 04/11/23 2054     04/11/23 2100  metroNIDAZOLE (FLAGYL) IVPB 500 mg        500 mg 100 mL/hr over 60 Minutes Intravenous Every 12 hours 04/11/23 2054     04/11/23 1900  cefTRIAXone (ROCEPHIN) 1 g in sodium chloride 0.9 % 100 mL IVPB        1 g 200 mL/hr over 30 Minutes Intravenous  Once 04/11/23 1852  04/11/23 2007         Subjective: Sedated.  Drowsy. In PACU.  Just returned from the OR.  Objective: Vitals:   04/12/23 1128 04/12/23 1130 04/12/23 1142 04/12/23 1145  BP:  (!) 157/51  (!) 151/72  Pulse: 65 66 63 72  Resp: 18 12 16 16   Temp:  (!) 97.3 F (36.3 C)    TempSrc:      SpO2: 95% 94% 95% 96%  Weight:       Height:        Intake/Output Summary (Last 24 hours) at 04/12/2023 1147 Last data filed at 04/12/2023 1030 Gross per 24 hour  Intake 1900 ml  Output 10 ml  Net 1890 ml   Filed Weights   04/11/23 1525 04/12/23 0824  Weight: 77.6 kg 77.6 kg    Examination:  General exam: Drowsy.  In PACU. Respiratory system: Clear to auscultation anterior lung fields.Marland Kitchen Respiratory effort normal. Cardiovascular system: S1 & S2 heard, RRR. No JVD, murmurs, rubs, gallops or clicks. No pedal edema. Gastrointestinal system: Abdomen is nondistended, soft and some diffuse tenderness to palpation.  Ice pack on abdominal region postoperatively.  Central nervous system: Sedated.  Moving extremities spontaneously.  No focal neurological deficits. Extremities: Symmetric 5 x 5 power. Skin: No rashes, lesions or ulcers Psychiatry: Judgement and insight unable to assess. Mood & affect unable to assess.      Data Reviewed: I have personally reviewed following labs and imaging studies  CBC: Recent Labs  Lab 04/11/23 1527 04/12/23 0651  WBC 9.9 16.2*  HGB 15.4* 13.6  HCT 46.1* 40.2  MCV 91.7 90.5  PLT 324 260    Basic Metabolic Panel: Recent Labs  Lab 04/11/23 1527 04/12/23 0651  NA 136 136  K 3.8 3.9  CL 100 102  CO2 24 25  GLUCOSE 126* 205*  BUN 18 21  CREATININE 0.87 0.96  CALCIUM 9.7 9.1    GFR: Estimated Creatinine Clearance: 43.3 mL/min (by C-G formula based on SCr of 0.96 mg/dL).  Liver Function Tests: Recent Labs  Lab 04/11/23 1527 04/12/23 0651  AST 115* 292*  ALT 58* 182*  ALKPHOS 94 103  BILITOT 1.5* 3.5*  PROT 7.8 7.4  ALBUMIN 4.0 3.6    CBG: No results for input(s): "GLUCAP" in the last 168 hours.   No results found for this or any previous visit (from the past 240 hour(s)).       Radiology Studies: DG Chest Portable 1 View  Result Date: 04/11/2023 CLINICAL DATA:  Chest pain EXAM: PORTABLE CHEST 1 VIEW COMPARISON:  None Available. FINDINGS:  Underinflation. Enlarged cardiopericardial silhouette with calcified aorta. Basilar atelectasis. Overlapping cardiac leads. Curvature and degenerative changes along the spine. Calcifications along the tracheobronchial tree. IMPRESSION: Enlarged cardiopericardial silhouette. Basilar atelectasis. Underinflation. Electronically Signed   By: Karen Kays M.D.   On: 04/11/2023 18:44   US ABDOMEN LIMITED RUQ (LIVER/GB)  Result Date: 04/11/2023 CLINICAL DATA:  Right upper quadrant pain for 36 hours EXAM: ULTRASOUND ABDOMEN LIMITED RIGHT UPPER QUADRANT COMPARISON:  Chest CT scan with contrast 03/28/2023 FINDINGS: Gallbladder: Dilated gallbladder with sludge and stones. Borderline wall thickness at 3 mm. The sonographer does report pain when scanning of the gallbladder. No adjacent fluid. Common bile duct: Diameter: 8 mm, within normal limits for patient's age. Liver: Diffusely echogenic hepatic parenchyma consistent with fatty liver infiltration. Portal vein is patent on color Doppler imaging with normal direction of blood flow towards the liver. Other: None. IMPRESSION: Dilated gallbladder with sludge and  stones. Borderline wall thickness and there is a positive sonographic Murphy's sign. Possible acute cholecystitis. Please correlate with other clinical symptoms and if needed confirmatory HIDA scan could be considered as clinically appropriate. Fatty liver infiltration. Electronically Signed   By: Karen Kays M.D.   On: 04/11/2023 18:43        Scheduled Meds:  arformoterol  15 mcg Nebulization BID   budesonide (PULMICORT) nebulizer solution  0.5 mg Nebulization BID   fluticasone  2 spray Each Nare Daily   folic acid  800 mcg Oral Daily   [START ON 04/13/2023] influenza vaccine adjuvanted  0.5 mL Intramuscular Tomorrow-1000   [MAR Hold] ipratropium-albuterol  3 mL Nebulization Q6H   loratadine  10 mg Oral Daily   metoprolol succinate  100 mg Oral q morning   metoprolol succinate  25 mg Oral QPM    pantoprazole  40 mg Oral Daily   Vitamin D3  2,000 Units Oral Daily   Continuous Infusions:  [MAR Hold] cefTRIAXone (ROCEPHIN)  IV     Followed by   [facility] Hold] cefTRIAXone (ROCEPHIN)  IV     lactated ringers Stopped (04/12/23 0816)   lactated ringers 10 mL/hr at 04/12/23 0910   [MAR Hold] metronidazole Stopped (04/11/23 2350)     LOS: 1 day    Time spent: 35 minutes    Ramiro Harvest, MD [facility]   To contact the attending provider between 7A-7P or the covering provider during after hours 7P-7A, please log into the web site [email or web address] and access using universal [place] password for that web site. If you do not have the password, please call the hospital operator.  04/12/2023, 11:47 AM

## 2023-04-12 NOTE — Plan of Care (Signed)
  Problem: Pain Managment: Goal: General experience of comfort will improve Outcome: Progressing   Problem: Safety: Goal: Ability to remain free from injury will improve Outcome: Progressing   

## 2023-04-12 NOTE — ED Notes (Signed)
Pt assisted off bedside commode and back safe into bed. Wasted discarded and peri care provided.

## 2023-04-12 NOTE — Anesthesia Procedure Notes (Signed)
Procedure Name: Intubation Date/Time: 04/12/2023 9:22 AM  Performed by: Karoline Caldwell, CRNAPre-anesthesia Checklist: Patient identified, Patient being monitored, Timeout performed, Emergency Drugs available and Suction available Patient Re-evaluated:Patient Re-evaluated prior to induction Oxygen Delivery Method: Circle system utilized Preoxygenation: Pre-oxygenation with 100% oxygen Induction Type: IV induction Ventilation: Mask ventilation without difficulty Laryngoscope Size: 3 and McGraph Grade View: Grade I Tube type: Oral Tube size: 7.0 mm Number of attempts: 1 Airway Equipment and Method: Stylet Placement Confirmation: ETT inserted through vocal cords under direct vision, positive ETCO2 and breath sounds checked- equal and bilateral Secured at: 21 cm Tube secured with: Tape Dental Injury: Teeth and Oropharynx as per pre-operative assessment

## 2023-04-12 NOTE — Anesthesia Postprocedure Evaluation (Signed)
Anesthesia Post Note  Patient: Janet Rios  Procedure(s) Performed: XI ROBOTIC ASSISTED LAPAROSCOPIC CHOLECYSTECTOMY  Patient location during evaluation: PACU Anesthesia Type: General Level of consciousness: awake and alert Pain management: pain level controlled Vital Signs Assessment: post-procedure vital signs reviewed and stable Respiratory status: spontaneous breathing, nonlabored ventilation, respiratory function stable and patient connected to nasal cannula oxygen Cardiovascular status: blood pressure returned to baseline and stable Postop Assessment: no apparent nausea or vomiting Anesthetic complications: no   No notable events documented.   Last Vitals:  Vitals:   04/12/23 1208 04/12/23 1221  BP:  (!) 161/67  Pulse: 65 64  Resp: 18 18  Temp:  36.6 C  SpO2: 95% 94%    Last Pain:  Vitals:   04/12/23 1208  TempSrc:   PainSc: 4                  Cleda Mccreedy Mazal Ebey

## 2023-04-12 NOTE — ED Notes (Signed)
Pt assisted to bedside comode, daughter is in the room and was instructed to let this tech know when the pt was finished.

## 2023-04-13 DIAGNOSIS — R079 Chest pain, unspecified: Secondary | ICD-10-CM | POA: Diagnosis not present

## 2023-04-13 DIAGNOSIS — K810 Acute cholecystitis: Secondary | ICD-10-CM | POA: Diagnosis not present

## 2023-04-13 DIAGNOSIS — J441 Chronic obstructive pulmonary disease with (acute) exacerbation: Secondary | ICD-10-CM | POA: Diagnosis not present

## 2023-04-13 DIAGNOSIS — I10 Essential (primary) hypertension: Secondary | ICD-10-CM | POA: Diagnosis not present

## 2023-04-13 LAB — COMPREHENSIVE METABOLIC PANEL
ALT: 212 U/L — ABNORMAL HIGH (ref 0–44)
AST: 244 U/L — ABNORMAL HIGH (ref 15–41)
Albumin: 3.3 g/dL — ABNORMAL LOW (ref 3.5–5.0)
Alkaline Phosphatase: 88 U/L (ref 38–126)
Anion gap: 10 (ref 5–15)
BUN: 19 mg/dL (ref 8–23)
CO2: 25 mmol/L (ref 22–32)
Calcium: 8.7 mg/dL — ABNORMAL LOW (ref 8.9–10.3)
Chloride: 98 mmol/L (ref 98–111)
Creatinine, Ser: 1.03 mg/dL — ABNORMAL HIGH (ref 0.44–1.00)
GFR, Estimated: 55 mL/min — ABNORMAL LOW (ref >60)
Glucose, Bld: 167 mg/dL — ABNORMAL HIGH (ref 70–99)
Potassium: 4 mmol/L (ref 3.5–5.1)
Sodium: 133 mmol/L — ABNORMAL LOW (ref 135–145)
Total Bilirubin: 2 mg/dL — ABNORMAL HIGH (ref 0.3–1.2)
Total Protein: 6.5 g/dL (ref 6.5–8.1)

## 2023-04-13 LAB — SURGICAL PATHOLOGY

## 2023-04-13 LAB — CBC
HCT: 37.7 % (ref 36.0–46.0)
Hemoglobin: 12.7 g/dL (ref 12.0–15.0)
MCH: 31.1 pg (ref 26.0–34.0)
MCHC: 33.7 g/dL (ref 30.0–36.0)
MCV: 92.4 fL (ref 80.0–100.0)
Platelets: 212 10*3/uL (ref 150–400)
RBC: 4.08 MIL/uL (ref 3.87–5.11)
RDW: 12.5 % (ref 11.5–15.5)
WBC: 11.7 10*3/uL — ABNORMAL HIGH (ref 4.0–10.5)
nRBC: 0 % (ref 0.0–0.2)

## 2023-04-13 MED ORDER — IPRATROPIUM-ALBUTEROL 0.5-2.5 (3) MG/3ML IN SOLN
RESPIRATORY_TRACT | Status: AC
  Filled 2023-04-13: qty 3

## 2023-04-13 MED ORDER — ENOXAPARIN SODIUM 40 MG/0.4ML IJ SOSY
PREFILLED_SYRINGE | INTRAMUSCULAR | Status: AC
  Filled 2023-04-13: qty 0.4

## 2023-04-13 MED ORDER — OXYCODONE HCL 5 MG PO TABS: 5.0000 mg | ORAL_TABLET | Freq: Three times a day (TID) | ORAL | 0 refills | Status: DC | PRN

## 2023-04-13 MED ORDER — DOCUSATE SODIUM 100 MG PO CAPS: 100.0000 mg | ORAL_CAPSULE | Freq: Two times a day (BID) | ORAL | 0 refills | Status: AC | PRN

## 2023-04-13 MED ORDER — ACETAMINOPHEN 500 MG PO TABS
ORAL_TABLET | ORAL | Status: AC
  Filled 2023-04-13: qty 2

## 2023-04-13 MED ORDER — ALBUTEROL SULFATE (2.5 MG/3ML) 0.083% IN NEBU
INHALATION_SOLUTION | RESPIRATORY_TRACT | Status: AC
  Filled 2023-04-13: qty 3

## 2023-04-13 MED ORDER — LORATADINE 10 MG PO TABS
ORAL_TABLET | ORAL | Status: AC
  Filled 2023-04-13: qty 1

## 2023-04-13 MED ORDER — ALPRAZOLAM 0.5 MG PO TABS: 0.2500 mg | ORAL_TABLET | Freq: Every evening | ORAL | Status: DC | PRN

## 2023-04-13 MED ORDER — PANTOPRAZOLE SODIUM 40 MG PO TBEC
DELAYED_RELEASE_TABLET | ORAL | Status: AC
  Filled 2023-04-13: qty 1

## 2023-04-13 MED ORDER — ACETAMINOPHEN 325 MG PO TABS: 650.0000 mg | ORAL_TABLET | Freq: Three times a day (TID) | ORAL | 0 refills | Status: AC | PRN

## 2023-04-13 NOTE — Evaluation (Signed)
Occupational Therapy Evaluation Patient Details Name: Janet Rios MRN: [id number] DOB: Apr 28, 1941 Today's Date: 04/13/2023   History of Present Illness 82 y/o female s/p for laparoscopic cholecystectomy.   Clinical Impression   Upon entering the room, pt supine in bed and agreeable to OT evaluation. Pt lives at home alone and is Ind at baseline for self care and IADLs without use of AD for mobility. Pt's daughter is present in room and reports she will be staying with her mother at discharge to assist as needed. Pt needing min guard for supine >sit secondary to soreness from incision. Pt stands with HHA initially progressing to supervision for 100'. Pt then utilizing bathroom for toileting with supervision overall. Pt on RA throughout session and remaining above 95% with ambulation. Pt does not need further skilled OT need at this time. OT to complete orders.             Equipment Recommendations  None recommended by OT       Precautions / Restrictions Precautions Precautions: Fall      Mobility Bed Mobility Overal bed mobility: Needs Assistance Bed Mobility: Supine to Sit, Sit to Supine     Supine to sit: Contact guard Sit to supine: Supervision   General bed mobility comments: CGA secondary to abdominal incision soreness    Transfers Overall transfer level: Needs assistance Equipment used: 1 person hand held assist Transfers: Sit to/from Stand Sit to Stand: Contact guard assist, Min assist                  Balance Overall balance assessment: Needs assistance Sitting-balance support: Feet supported Sitting balance-Leahy Scale: Good     Standing balance support: Reliant on assistive device for balance, During functional activity, Single extremity supported Standing balance-Leahy Scale: Fair                             ADL either performed or assessed with clinical judgement   ADL Overall ADL's : Needs assistance/impaired     Grooming:  Wash/dry hands;Standing;Supervision/safety                   Toilet Transfer: Contact guard assist   Toileting- Clothing Manipulation and Hygiene: Contact guard assist;Sit to/from stand               Vision Patient Visual Report: No change from baseline              Pertinent Vitals/Pain Pain Assessment Pain Assessment: No/denies pain     Extremity/Trunk Assessment Upper Extremity Assessment Upper Extremity Assessment: Generalized weakness   Lower Extremity Assessment Lower Extremity Assessment: Generalized weakness       Communication Communication Communication: No apparent difficulties   Cognition Arousal: Alert Behavior During Therapy: WFL for tasks assessed/performed Overall Cognitive Status: Within Functional Limits for tasks assessed                                                  Home Living Family/patient expects to be discharged to:: Private residence Living Arrangements: Alone Available Help at Discharge: Family;Available 24 hours/day (daughter will be staying with her a few weeks) Type of Home: House Home Access: Stairs to enter [employer] of Steps: 1 STE with 4 steps in foyer to get to main level of home Entrance Stairs-Rails:  Left Home Layout: One level     Bathroom Shower/Tub: Walk-in shower         Home Equipment: Rollator (4 wheels);Cane - single point;Shower seat          Prior Functioning/Environment Prior Level of Function : Independent/Modified Independent;Driving                                 OT Goals(Current goals can be found in the care plan section) Acute Rehab OT Goals Patient Stated Goal: to go home OT Goal Formulation: With patient/family Time For Goal Achievement: 04/27/23 Potential to Achieve Goals: Fair  OT Frequency:         AM-PAC OT "6 Clicks" Daily Activity     Outcome Measure Help from another person eating meals?: None Help from another person  taking care of personal grooming?: None Help from another person toileting, which includes using toliet, bedpan, or urinal?: None Help from another person bathing (including washing, rinsing, drying)?: A Little Help from another person to put on and taking off regular upper body clothing?: None Help from another person to put on and taking off regular lower body clothing?: A Little 6 Click Score: 22   End of Session Nurse Communication: Mobility status  Activity Tolerance: Patient tolerated treatment well Patient left: in bed;with call bell/phone within reach;with bed alarm set                   Time: 1121-1141 OT Time Calculation (min): 20 min Charges:  OT General Charges $OT Visit: 1 Visit OT Evaluation $OT Eval Moderate Complexity: 1 Mod OT Treatments $Self Care/Home Management : 8-22 mins  Jackquline Denmark, MS, OTR/L , CBIS ascom [phone]  04/13/23, 1:39 PM

## 2023-04-13 NOTE — Discharge Instructions (Signed)
Laparoscopic Cholecystectomy, Care After This sheet gives you information about how to care for yourself after your procedure. Your doctor may also give you more specific instructions. If you have problems or questions, contact your doctor. Follow these instructions at home: Care for cuts from surgery (incisions)  Follow instructions from your doctor about how to take care of your cuts from surgery. Make sure you: Wash your hands with soap and water before you change your bandage (dressing). If you cannot use soap and water, use hand sanitizer. Change your bandage as told by your doctor. Leave stitches (sutures), skin glue, or skin tape (adhesive) strips in place. They may need to stay in place for 2 weeks or longer. If tape strips get loose and curl up, you may trim the loose edges. Do not remove tape strips completely unless your doctor says it is okay. Do not take baths, swim, or use a hot tub until your doctor says it is okay. OK TO SHOWER 24HRS AFTER YOUR SURGERY.  Check your surgical cut area every day for signs of infection. Check for: More redness, swelling, or pain. More fluid or blood. Warmth. Pus or a bad smell. Activity Do not drive or use heavy machinery while taking prescription pain medicine. Do not play contact sports until your doctor says it is okay. Do not drive for 24 hours if you were given a medicine to help you relax (sedative). Rest as needed. Do not return to work or school until your doctor says it is okay. General instructions  tylenol and advil as needed for discomfort.  Please alternate between the two every four hours as needed for pain.    Use narcotics, if prescribed, only when tylenol and motrin is not enough to control pain.  325-650mg every 8hrs to max of 3000mg/24hrs (including the 325mg in every norco dose) for the tylenol.    Advil up to 800mg per dose every 8hrs as needed for pain.   To prevent or treat constipation while you are taking prescription  pain medicine, your doctor may recommend that you: Drink enough fluid to keep your pee (urine) clear or pale yellow. Take over-the-counter or prescription medicines. Eat foods that are high in fiber, such as fresh fruits and vegetables, whole grains, and beans. Limit foods that are high in fat and processed sugars, such as fried and sweet foods. Contact a doctor if: You develop a rash. You have more redness, swelling, or pain around your surgical cuts. You have more fluid or blood coming from your surgical cuts. Your surgical cuts feel warm to the touch. You have pus or a bad smell coming from your surgical cuts. You have a fever. One or more of your surgical cuts breaks open. You have trouble breathing. You have chest pain. You have pain that is getting worse in your shoulders. You faint or feel dizzy when you stand. You have very bad pain in your belly (abdomen). You are sick to your stomach (nauseous) for more than one day. You have throwing up (vomiting) that lasts for more than one day. You have leg pain. This information is not intended to replace advice given to you by your health care provider. Make sure you discuss any questions you have with your health care provider. Document Released: 04/26/2008 Document Revised: 02/06/2016 Document Reviewed: 01/04/2016 Elsevier Interactive Patient Education  2019 [employer]  

## 2023-04-13 NOTE — Progress Notes (Addendum)
PROGRESS NOTE    RAMIKA DERMAN  ZOX:[id number] DOB: 11-Oct-1940 DOA: 04/11/2023 PCP: Danella Penton, MD    Chief Complaint  Patient presents with   Abdominal Pain    Brief Narrative:  Patient 82 year old female history of hypertension, COPD presented to the ED with a 1 day history of upper abdominal pain bandlike across upper abdomen worse with oral intake associated with nausea and sensation of bloating.  Patient also with complaints of mild shortness of breath beyond her baseline had some chest tightness associated with abdominal pain.  Patient seen by PCP at [facility] clinic sent to the ED for further evaluation.  Patient seen in the ED noted to have a transaminitis, right upper quadrant ultrasound done consistent with acute cholecystitis.  Patient seen in consultation by general surgery and requested hospitalist admission due to patient's complaints of shortness of breath and chest tightness.  Patient taken to the OR 04/12/2023 for laparoscopic cholecystectomy.   Assessment & Plan:   Principal Problem:   Acute cholecystitis Active Problems:   COPD with acute exacerbation (HCC)   Chest pain   Benign essential hypertension   Hypertension  #1 acute cholecystitis -Patient presented with upper abdominal pain postprandially x 1 day with nausea.- -Right upper quadrant ultrasound done on admission consistent with cholecystitis. -Lab work with a transaminitis on comprehensive metabolic profile. -LFTs elevated, bilirubin slowly trending down. -Patient seen in consultation by general surgery and patient underwent laparoscopic cholecystectomy on 04/12/2023. -Continue IV antibiotics of Rocephin and Flagyl, IV antiemetics, supportive care. -Per general surgery.  2.  COPD with acute exacerbation -Patient noted to have received a dose of IV Solu-Medrol in the ED however with ongoing infection steroids currently on hold. -Patient with no significant wheezing on examination with clinical  improvement.   -Continue scheduled DuoNebs, Flonase, Brovana, Pulmicort, PPI.  -Check ambulatory sats.  3.  Hypertension -Pain likely contributing to elevated blood pressures on admission. -Continue current regimen of Toprol-XL.   -It is noted that patient was on lisinopril HCTZ which was discontinued per her provider per med rec.   -Supportive care.    4.  Chest pain -Patient noted to have some chest tightness along with abdominal pain, likely referred pain secondary to acute cholecystitis. -Currently resolved, asymptomatic. -Cardiac enzymes negative x 2.  EKG with no ischemic changes noted. -Patient with no prior history of CAD. -See numbers 1, 2.  DVT prophylaxis: SCDs Code Status: Full Family Communication: Updated patient.  No family at bedside.  Disposition: Likely home in the next 24 hours if continued clinical improvement.    Status is: Inpatient Remains inpatient appropriate because: Severity of illness   Consultants:  General Surgery: Dr. Tonna Boehringer  Procedures:  Chest x-ray 04/11/2023 Right upper quadrant ultrasound 04/11/2023 Robotic assisted laparoscopic cholecystectomy per General Surgery: Dr. Tonna Boehringer, 04/12/2023  Antimicrobials:  Anti-infectives (From admission, onward)    Start     Dose/Rate Route Frequency Ordered Stop   04/12/23 2100  cefTRIAXone (ROCEPHIN) 2 g in sodium chloride 0.9 % 100 mL IVPB       Placed in "Followed by" Linked Group   2 g 200 mL/hr over 30 Minutes Intravenous Every 24 hours 04/11/23 2054     04/11/23 2100  cefTRIAXone (ROCEPHIN) 1 g in sodium chloride 0.9 % 100 mL IVPB       Placed in "Followed by" Linked Group   1 g 200 mL/hr over 30 Minutes Intravenous  Once 04/11/23 2054     04/11/23 2100  metroNIDAZOLE (FLAGYL) IVPB  500 mg        500 mg 100 mL/hr over 60 Minutes Intravenous Every 12 hours 04/11/23 2054     04/11/23 1900  cefTRIAXone (ROCEPHIN) 1 g in sodium chloride 0.9 % 100 mL IVPB        1 g 200 mL/hr over 30 Minutes Intravenous   Once 04/11/23 1852 04/11/23 2007         Subjective: Patient sitting on the side of the bed about to eat her breakfast.  Denies any chest pain.  States shortness of breath is improved.  Satting greater than 95% on room air.  Stated had some abdominal discomfort but abdominal pain significantly improved postoperatively.  Just saw general surgeon.    Objective: Vitals:   04/13/23 0531 04/13/23 0754 04/13/23 0815 04/13/23 0938  BP: (!) 143/65  (!) 153/64   Pulse: 63  71   Resp: 16  18   Temp: 98 F (36.7 C)  98.5 F (36.9 C)   TempSrc: Temporal  Temporal   SpO2: 98% 99% 99% 97%  Weight:      Height:        Intake/Output Summary (Last 24 hours) at 04/13/2023 1037 Last data filed at 04/13/2023 0300 Gross per 24 hour  Intake 1145.54 ml  Output --  Net 1145.54 ml   Filed Weights   04/11/23 1525 04/12/23 0824  Weight: 77.6 kg 77.6 kg    Examination:  General exam: Alert. Respiratory system: Lungs clear to auscultation bilaterally.  No wheezes, no crackles, no rhonchi.  Fair air movement.  Speaking in full sentences.   Cardiovascular system: Regular rate rhythm no murmurs rubs or gallops.  No JVD.  No lower extremity edema.  Gastrointestinal system: Abdomen is soft, mildly distended, positive bowel sounds, nontender to palpation.  Incisions sites c/d/I. Central nervous system: Alert and oriented to self place and time.  Moving extremities spontaneously.  No focal neurological deficits.  Extremities: Symmetric 5 x 5 power. Skin: No rashes, lesions or ulcers Psychiatry: Judgement and insight normal.  Mood and affect appropriate.     Data Reviewed: I have personally reviewed following labs and imaging studies  CBC: Recent Labs  Lab 04/11/23 1527 04/12/23 0651 04/13/23 0515  WBC 9.9 16.2* 11.7*  HGB 15.4* 13.6 12.7  HCT 46.1* 40.2 37.7  MCV 91.7 90.5 92.4  PLT 324 260 212    Basic Metabolic Panel: Recent Labs  Lab 04/11/23 1527 04/12/23 0651 04/13/23 0515  NA  136 136 133*  K 3.8 3.9 4.0  CL 100 102 98  CO2 24 25 25   GLUCOSE 126* 205* 167*  BUN 18 21 19   CREATININE 0.87 0.96 1.03*  CALCIUM 9.7 9.1 8.7*    GFR: Estimated Creatinine Clearance: 40.4 mL/min (A) (by C-G formula based on SCr of 1.03 mg/dL (H)).  Liver Function Tests: Recent Labs  Lab 04/11/23 1527 04/12/23 0651 04/13/23 0515  AST 115* 292* 244*  ALT 58* 182* 212*  ALKPHOS 94 103 88  BILITOT 1.5* 3.5* 2.0*  PROT 7.8 7.4 6.5  ALBUMIN 4.0 3.6 3.3*    CBG: No results for input(s): "GLUCAP" in the last 168 hours.   No results found for this or any previous visit (from the past 240 hour(s)).       Radiology Studies: DG Chest Portable 1 View  Result Date: 04/11/2023 CLINICAL DATA:  Chest pain EXAM: PORTABLE CHEST 1 VIEW COMPARISON:  None Available. FINDINGS: Underinflation. Enlarged cardiopericardial silhouette with calcified aorta. Basilar atelectasis. Overlapping cardiac leads.  Curvature and degenerative changes along the spine. Calcifications along the tracheobronchial tree. IMPRESSION: Enlarged cardiopericardial silhouette. Basilar atelectasis. Underinflation. Electronically Signed   By: Karen Kays M.D.   On: 04/11/2023 18:44   US ABDOMEN LIMITED RUQ (LIVER/GB)  Result Date: 04/11/2023 CLINICAL DATA:  Right upper quadrant pain for 36 hours EXAM: ULTRASOUND ABDOMEN LIMITED RIGHT UPPER QUADRANT COMPARISON:  Chest CT scan with contrast 03/28/2023 FINDINGS: Gallbladder: Dilated gallbladder with sludge and stones. Borderline wall thickness at 3 mm. The sonographer does report pain when scanning of the gallbladder. No adjacent fluid. Common bile duct: Diameter: 8 mm, within normal limits for patient's age. Liver: Diffusely echogenic hepatic parenchyma consistent with fatty liver infiltration. Portal vein is patent on color Doppler imaging with normal direction of blood flow towards the liver. Other: None. IMPRESSION: Dilated gallbladder with sludge and stones. Borderline  wall thickness and there is a positive sonographic Murphy's sign. Possible acute cholecystitis. Please correlate with other clinical symptoms and if needed confirmatory HIDA scan could be considered as clinically appropriate. Fatty liver infiltration. Electronically Signed   By: Karen Kays M.D.   On: 04/11/2023 18:43        Scheduled Meds:  arformoterol  15 mcg Nebulization BID   budesonide (PULMICORT) nebulizer solution  0.5 mg Nebulization BID   cholecalciferol  2,000 Units Oral QHS   enoxaparin (LOVENOX) injection  40 mg Subcutaneous Q24H   fluticasone  2 spray Each Nare Daily   folic acid  1,000 mcg Oral QHS   influenza vaccine adjuvanted  0.5 mL Intramuscular Tomorrow-1000   ipratropium-albuterol  3 mL Nebulization Q6H   loratadine  10 mg Oral Daily   metoprolol succinate  100 mg Oral q morning   metoprolol succinate  25 mg Oral QPM   pantoprazole  40 mg Oral Daily   Continuous Infusions:  cefTRIAXone (ROCEPHIN)  IV     Followed by   cefTRIAXone (ROCEPHIN)  IV Stopped (04/12/23 2209)   metronidazole 500 mg (04/13/23 0940)     LOS: 2 days    Time spent: 35 minutes    Ramiro Harvest, MD [facility]   To contact the attending provider between 7A-7P or the covering provider during after hours 7P-7A, please log into the web site [email or web address] and access using universal [place] password for that web site. If you do not have the password, please call the hospital operator.  04/13/2023, 10:37 AM

## 2023-04-13 NOTE — Evaluation (Signed)
Physical Therapy Evaluation Patient Details Name: Janet Rios MRN: [id number] DOB: 1940-12-20 Today's Date: 04/13/2023  History of Present Illness  82 y/o female s/p for laparoscopic cholecystectomy.  Clinical Impression  Pt is received in bed with son-in-law at bedside, she is agreeable to PT session. Performed session on RA monitoring SpO2 at rest and with mobility 93-97%. Pt performs bed mobility and transfers, stairs CGA, and amb sup A for safety during today's session. Pt able to amb approx 200 ft and perform stair negotiation CGA with BUE support to promote PLOF. No reports of abdominal pain/discomfort. Will discharge Pt from skilled PT services at this time due to Pt at baseline.       If plan is discharge home, recommend the following: A little help with walking and/or transfers;A little help with bathing/dressing/bathroom;Assist for transportation;Help with stairs or ramp for entrance   Can travel by private vehicle        Equipment Recommendations None recommended by PT  Recommendations for Other Services       Functional Status Assessment Patient has not had a recent decline in their functional status     Precautions / Restrictions Precautions Precautions: Fall Restrictions Weight Bearing Restrictions: No      Mobility  Bed Mobility Overal bed mobility: Needs Assistance Bed Mobility: Supine to Sit, Sit to Supine     Supine to sit: Contact guard, HOB elevated, Used rails Sit to supine: Supervision, HOB elevated, Used rails   General bed mobility comments: CGA secondary to abdominal incision soreness    Transfers Overall transfer level: Needs assistance Equipment used: Rolling walker (2 wheels) Transfers: Sit to/from Stand Sit to Stand: Contact guard assist           General transfer comment: cuing for hand placement    Ambulation/Gait Ambulation/Gait assistance: Supervision Gait Distance (Feet): 200 Feet Assistive device: Rolling walker (2  wheels) Gait Pattern/deviations: WFL(Within Functional Limits), Step-through pattern, Decreased stride length Gait velocity: slight decreased     General Gait Details: cuing for AD management  Stairs Stairs: Yes Stairs assistance: Contact guard assist Stair Management: One rail Left Number of Stairs: 4 General stair comments: education and demo stair negotiation using BUE support for safety  Wheelchair Mobility     Tilt Bed    Modified Rankin (Stroke Patients Only)       Balance Overall balance assessment: Needs assistance Sitting-balance support: Feet supported Sitting balance-Leahy Scale: Good Sitting balance - Comments: Able to maintain seated EOB balance during functional activity   Standing balance support: Reliant on assistive device for balance, During functional activity, Single extremity supported Standing balance-Leahy Scale: Good Standing balance comment: Able to maintain static standing balance during functional activities                             Pertinent Vitals/Pain Pain Assessment Pain Assessment: No/denies pain    Home Living Family/patient expects to be discharged to:: Private residence Living Arrangements: Alone Available Help at Discharge: Family;Available 24 hours/day Type of Home: House Home Access: Stairs to enter Entrance Stairs-Rails: Left Entrance Stairs-Number of Steps: 1 STE with 4 steps in foyer to get to main level of home   Home Layout: One level Home Equipment: Rollator (4 wheels);Cane - single point;Shower seat      Prior Function Prior Level of Function : Independent/Modified Independent;Driving             Mobility Comments: no AD for amb but  has RW just in case       Extremity/Trunk Assessment   Upper Extremity Assessment Upper Extremity Assessment: Defer to OT evaluation    Lower Extremity Assessment Lower Extremity Assessment: Overall WFL for tasks assessed       Communication    Communication Communication: No apparent difficulties  Cognition Arousal: Alert Behavior During Therapy: WFL for tasks assessed/performed Overall Cognitive Status: Within Functional Limits for tasks assessed                                 General Comments: Pleasant and cooperative for PT session        General Comments General comments (skin integrity, edema, etc.): Trial mobility on RA throughout session: rest prior to mobility 97% SpO2, following amb 94% SpO2, following stairs 95% SpO2, at end of session 95% SpO2    Exercises     Assessment/Plan    PT Assessment Patient needs continued PT services;Patient does not need any further PT services  PT Problem List Decreased activity tolerance;Decreased balance       PT Treatment Interventions DME instruction;Gait training;Stair training;Functional mobility training;Therapeutic activities;Therapeutic exercise    PT Goals (Current goals can be found in the Care Plan section)  Acute Rehab PT Goals Patient Stated Goal: to go home PT Goal Formulation: With patient Time For Goal Achievement: 04/13/23 Potential to Achieve Goals: Good    Frequency Min 1X/week     Co-evaluation               AM-PAC PT "6 Clicks" Mobility  Outcome Measure Help needed turning from your back to your side while in a flat bed without using bedrails?: None Help needed moving from lying on your back to sitting on the side of a flat bed without using bedrails?: None Help needed moving to and from a bed to a chair (including a wheelchair)?: None Help needed standing up from a chair using your arms (e.g., wheelchair or bedside chair)?: A Little Help needed to walk in hospital room?: A Little Help needed climbing 3-5 steps with a railing? : A Little 6 Click Score: 21    End of Session Equipment Utilized During Treatment: Gait belt Activity Tolerance: Patient tolerated treatment well Patient left: in bed;with call bell/phone within  reach;with bed alarm set;with family/visitor present Nurse Communication: Mobility status PT Visit Diagnosis: Unsteadiness on feet (R26.81)    Time: 5284-1324 PT Time Calculation (min) (ACUTE ONLY): 15 min   Charges:                 Elmon Else, SPT   Damico Partin 04/13/2023, 3:44 PM

## 2023-04-13 NOTE — Plan of Care (Signed)
  Problem: Activity: Goal: Ability to tolerate increased activity will improve Outcome: Progressing   Problem: Respiratory: Goal: Levels of oxygenation will improve Outcome: Progressing   Problem: Activity: Goal: Risk for activity intolerance will decrease Outcome: Progressing   Problem: Coping: Goal: Level of anxiety will decrease Outcome: Progressing   Problem: Elimination: Goal: Will not experience complications related to urinary retention Outcome: Progressing   Problem: Pain Managment: Goal: General experience of comfort will improve Outcome: Progressing

## 2023-04-13 NOTE — Plan of Care (Signed)
  Problem: Activity: Goal: Ability to tolerate increased activity will improve Outcome: Progressing   Problem: Activity: Goal: Risk for activity intolerance will decrease Outcome: Progressing   Problem: Nutrition: Goal: Adequate nutrition will be maintained Outcome: Progressing   Problem: Elimination: Goal: Will not experience complications related to urinary retention Outcome: Progressing

## 2023-04-13 NOTE — Progress Notes (Signed)
Subjective:  CC: Janet Rios is a 82 y.o. female  Hospital stay day 2, 1 Day Post-Op robotic lap chole for acute cholecystitis  HPI: No issues overnight, tolerated clears.  ROS:  General: Denies weight loss, weight gain, fatigue, fevers, chills, and night sweats. Heart: Denies chest pain, palpitations, racing heart, irregular heartbeat, leg pain or swelling, and decreased activity tolerance. Respiratory: Denies breathing difficulty, shortness of breath, wheezing, cough, and sputum. GI: Denies change in appetite, heartburn, nausea, vomiting, constipation, diarrhea, and blood in stool. GU: Denies difficulty urinating, pain with urinating, urgency, frequency, blood in urine.   Objective:   Temp:  [97.3 F (36.3 C)-98.5 F (36.9 C)] 98.5 F (36.9 C) (09/12 0815) Pulse Rate:  [57-72] 71 (09/12 0815) Resp:  [12-22] 18 (09/12 0815) BP: (143-161)/(51-74) 153/64 (09/12 0815) SpO2:  [93 %-100 %] 99 % (09/12 0815)     Height: 5\' 1"  (154.9 cm) Weight: 77.6 kg BMI (Calculated): 32.33   Intake/Output this shift:   Intake/Output Summary (Last 24 hours) at 04/13/2023 9528 Last data filed at 04/13/2023 0300 Gross per 24 hour  Intake 1945.54 ml  Output 10 ml  Net 1935.54 ml    Constitutional :  alert, cooperative, appears stated age, and no distress  Respiratory:  clear to auscultation bilaterally  Cardiovascular:  regular rate and rhythm  Gastrointestinal: Soft, no guarding, TTP around incisions as expected .   Skin: Cool and moist. Incisions c/d/i  Psychiatric: Normal affect, non-agitated, not confused       LABS:     Latest Ref Rng & Units 04/13/2023    5:15 AM 04/12/2023    6:51 AM 04/11/2023    3:27 PM  CMP  Glucose 70 - 99 mg/dL 413  244  010   BUN 8 - 23 mg/dL 19  21  18    Creatinine 0.44 - 1.00 mg/dL 2.72  5.36  6.44   Sodium 135 - 145 mmol/L 133  136  136   Potassium 3.5 - 5.1 mmol/L 4.0  3.9  3.8   Chloride 98 - 111 mmol/L 98  102  100   CO2 22 - 32 mmol/L 25  25  24     Calcium 8.9 - 10.3 mg/dL 8.7  9.1  9.7   Total Protein 6.5 - 8.1 g/dL 6.5  7.4  7.8   Total Bilirubin 0.3 - 1.2 mg/dL 2.0  3.5  1.5   Alkaline Phos 38 - 126 U/L 88  103  94   AST 15 - 41 U/L 244  292  115   ALT 0 - 44 U/L 212  182  58       Latest Ref Rng & Units 04/13/2023    5:15 AM 04/12/2023    6:51 AM 04/11/2023    3:27 PM  CBC  WBC 4.0 - 10.5 K/uL 11.7  16.2  9.9   Hemoglobin 12.0 - 15.0 g/dL 03.4  74.2  59.5   Hematocrit 36.0 - 46.0 % 37.7  40.2  46.1   Platelets 150 - 400 K/uL 212  260  324     RADS: N/a Assessment:   S/p  robotic lap chole for acute cholecystitis Ok to advance diet and d/c from surgery standpoint  labs/images/medications/previous chart entries reviewed personally and relevant changes/updates noted above.

## 2023-04-14 DIAGNOSIS — J441 Chronic obstructive pulmonary disease with (acute) exacerbation: Secondary | ICD-10-CM | POA: Diagnosis not present

## 2023-04-14 DIAGNOSIS — K810 Acute cholecystitis: Secondary | ICD-10-CM | POA: Diagnosis not present

## 2023-04-14 DIAGNOSIS — I10 Essential (primary) hypertension: Secondary | ICD-10-CM | POA: Diagnosis not present

## 2023-04-14 DIAGNOSIS — R079 Chest pain, unspecified: Secondary | ICD-10-CM | POA: Diagnosis not present

## 2023-04-14 LAB — COMPREHENSIVE METABOLIC PANEL
ALT: 221 U/L — ABNORMAL HIGH (ref 0–44)
AST: 194 U/L — ABNORMAL HIGH (ref 15–41)
Albumin: 3.3 g/dL — ABNORMAL LOW (ref 3.5–5.0)
Alkaline Phosphatase: 111 U/L (ref 38–126)
Anion gap: 10 (ref 5–15)
BUN: 13 mg/dL (ref 8–23)
CO2: 28 mmol/L (ref 22–32)
Calcium: 8.8 mg/dL — ABNORMAL LOW (ref 8.9–10.3)
Chloride: 100 mmol/L (ref 98–111)
Creatinine, Ser: 0.88 mg/dL (ref 0.44–1.00)
GFR, Estimated: >60 mL/min (ref >60)
Glucose, Bld: 115 mg/dL — ABNORMAL HIGH (ref 70–99)
Potassium: 3.7 mmol/L (ref 3.5–5.1)
Sodium: 138 mmol/L (ref 135–145)
Total Bilirubin: 1.7 mg/dL — ABNORMAL HIGH (ref 0.3–1.2)
Total Protein: 6.4 g/dL — ABNORMAL LOW (ref 6.5–8.1)

## 2023-04-14 LAB — CBC
HCT: 40 % (ref 36.0–46.0)
Hemoglobin: 13.4 g/dL (ref 12.0–15.0)
MCH: 30.5 pg (ref 26.0–34.0)
MCHC: 33.5 g/dL (ref 30.0–36.0)
MCV: 90.9 fL (ref 80.0–100.0)
Platelets: 238 10*3/uL (ref 150–400)
RBC: 4.4 MIL/uL (ref 3.87–5.11)
RDW: 12.6 % (ref 11.5–15.5)
WBC: 7.5 10*3/uL (ref 4.0–10.5)
nRBC: 0 % (ref 0.0–0.2)

## 2023-04-14 MED ORDER — ASPIRIN 81 MG PO TABS: 81.0000 mg | ORAL_TABLET | Freq: Every day | ORAL | Status: AC

## 2023-04-14 MED ORDER — POLYETHYLENE GLYCOL 3350 17 G PO PACK: 17.0000 g | PACK | Freq: Every day | ORAL | 0 refills | Status: AC | PRN

## 2023-04-14 MED ORDER — PANTOPRAZOLE SODIUM 40 MG PO TBEC
DELAYED_RELEASE_TABLET | ORAL | Status: AC
  Filled 2023-04-14: qty 1

## 2023-04-14 MED ORDER — LORATADINE 10 MG PO TABS
ORAL_TABLET | ORAL | Status: AC
  Filled 2023-04-14: qty 1

## 2023-04-14 MED ORDER — ENOXAPARIN SODIUM 40 MG/0.4ML IJ SOSY
PREFILLED_SYRINGE | INTRAMUSCULAR | Status: AC
  Filled 2023-04-14: qty 0.4

## 2023-04-14 MED ORDER — SIMETHICONE 80 MG PO TABS: 80.0000 mg | ORAL_TABLET | Freq: Three times a day (TID) | ORAL | Status: DC

## 2023-04-14 MED ORDER — BISACODYL 5 MG PO TBEC: 5.0000 mg | DELAYED_RELEASE_TABLET | Freq: Every day | ORAL | Status: AC | PRN

## 2023-04-14 MED ORDER — IPRATROPIUM-ALBUTEROL 0.5-2.5 (3) MG/3ML IN SOLN
RESPIRATORY_TRACT | Status: AC
  Filled 2023-04-14: qty 3

## 2023-04-14 NOTE — Care Management Important Message (Signed)
Important Message  Patient Details  Name: Janet Rios MRN: [id number] Date of Birth: 1941/06/01   Medicare Important Message Given:  Yes  Reviewed Medicare IM with patient, obtained initial consent.  Copy of Medicare IM left with patient for reference, original to be scanned into the chart.    Johnell Comings 04/14/2023, 10:46 AM

## 2023-04-14 NOTE — Progress Notes (Signed)
DISCHARGE NOTE  Pt given discharge instructions and verbalized understanding. Pt wheeled to car by staff. Daughter providing transportation home.

## 2023-04-14 NOTE — Discharge Summary (Signed)
Physician Discharge Summary  SOLEA OLP ZOX:[id number] DOB: 11-15-1940 DOA: 04/11/2023  PCP: Danella Penton, MD  Admit date: 04/11/2023 Discharge date: 04/14/2023  Time spent: 60 minutes  Recommendations for Outpatient Follow-up:  Follow-up with Dr. Tonna Boehringer, general surgery in 2 weeks for postop follow-up of laparoscopic cholecystectomy. Follow-up with Danella Penton, MD in 2 weeks.  On follow-up patient will need a comprehensive metabolic profile done to follow-up on electrolytes and renal function and LFTs.  CBC will need to be done to follow-up on H&H.  COPD will need to be followed up upon.   Discharge Diagnoses:  Principal Problem:   Acute cholecystitis Active Problems:   COPD with acute exacerbation (HCC)   Chest pain   Benign essential hypertension   Hypertension   Discharge Condition: Stable and improved.  Diet recommendation: Regular  Filed Weights   04/11/23 1525 04/12/23 0824  Weight: 77.6 kg 77.6 kg    History of present illness:  HPI per Dr. Georga Kaufmann is a 82 y.o. female with medical history significant for HTN, COPD who presents to the ED with a 1 day history of upper abdominal pain, bandlike across the upper abdomen, worse after eating, associated with nausea and a sensation of bloating.  She also complains of mild shortness of breath beyond her baseline and had brief chest tightness associated with the abdominal pain.  She denies fevers or chills.  She tried antacids without relief.  She was seen by her PCP at [facility] who sent her to the ED for further evaluation.  She was previously in her usual state of health. ED course and Data review: BP with systolic in the 170s and 180s with otherwise normal vitals. Labs with normal CBC. CMP significant for elevated LFTs with AST 115, ALT 58 and total bili 1.5.  Lipase normal. Troponin 5 UA unremarkable. EKG, personally viewed and interpreted showing NSR at 60 with no acute ST-T wave changes. Chest  x-ray showing enlarged cardiopericardial cardial silhouette, basilar atelectasis and underinflation. Right upper quadrant ultrasound consistent with cholecystitis as follows "IMPRESSION: Dilated gallbladder with sludge and stones. Borderline wall thickness and there is a positive sonographic Murphy's sign. Possible acute cholecystitis. Please correlate with other clinical symptoms and if needed confirmatory HIDA scan could be considered as clinically appropriate.  Fatty liver infiltration"   The ED provider: Spoke with surgeon Dr. Tonna Boehringer who would like to take patient to the OR in the a.m.  Requested hospitalist admission due to patient's complaints of shortness of breath and chest tightness. Patient was treated with DuoNebs and Solu-Medrol in the ED and was given a dose of ceftriaxone. Hospitalist consulted for admission.   Hospital Course:  #1 acute cholecystitis -Patient presented with upper abdominal pain postprandially x 1 day with nausea.- -Right upper quadrant ultrasound done on admission consistent with acute cholecystitis. -Lab work with a transaminitis on comprehensive metabolic profile. -LFTs elevated, bilirubin trended down as well as LFTs during the hospitalization postoperatively.   -Patient seen in consultation by general surgery and patient underwent laparoscopic cholecystectomy on 04/12/2023. -Patient maintained on IV Rocephin and Flagyl during the hospitalization and addition to IV antiemetics and supportive care.   -Patient improved clinically postoperatively, diet was advanced which she was tolerating, did not have any further significant abdominal pain and patient will be discharged in stable and improved condition.   -Outpatient follow-up with general surgery in 2 to 3 weeks.    2.  COPD with acute exacerbation -Patient noted to have  received a dose of IV Solu-Medrol in the ED however with ongoing infection steroids were held during the hospitalization.  -Patient with  no significant wheezing on examination with clinical improvement.   -Patient maintained on scheduled DuoNebs, Flonase, Brovana, Pulmicort, PPI.  -Ambulatory sats were obtained and patient noted to be satting 93 to 97% on room air with ambulation without any shortness of breath or wheezing noted. -Patient will be discharged back home on home regimen of COPD medications. -Outpatient follow-up with primary pulmonologist as previously scheduled.   3.  Hypertension -Pain likely contributing to elevated blood pressures on admission. -Patient maintained on home regimen Toprol-XL.   -It is noted that patient was on lisinopril HCTZ which was discontinued per her provider per med rec.   -Outpatient follow-up with PCP.   4.  Chest pain -Patient noted to have some chest tightness along with abdominal pain, likely referred pain secondary to acute cholecystitis. -Resolved during the hospitalization, patient remained asymptomatic.  -Cardiac enzymes negative x 2.  EKG with no ischemic changes noted. -Patient with no prior history of CAD. -See numbers 1, 2.    Procedures: Chest x-ray 04/11/2023 Right upper quadrant ultrasound 04/11/2023 Robotic assisted laparoscopic cholecystectomy per General Surgery: Dr. Tonna Boehringer, 04/12/2023  Consultations: General Surgery: Dr. Tonna Boehringer   Discharge Exam: Vitals:   04/14/23 0410 04/14/23 0739  BP: (!) 168/75 (!) 159/74  Pulse: 68 85  Resp: 18 16  Temp: 99.4 F (37.4 C)   SpO2: 93% 94%    General: NAD Cardiovascular: RRR no murmurs rubs or gallops.  No JVD.  No lower extremity edema. Respiratory: Clear to auscultation bilaterally.  No wheezes, no crackles, no rhonchi.  Fair air movement.  Speaking in full sentences.  Discharge Instructions   Discharge Instructions     Diet general   Complete by: As directed    Increase activity slowly   Complete by: As directed       Allergies as of 04/14/2023       Reactions   Alendronate Other (See Comments)   Joint  pain   Statins Other (See Comments)   Muscle pain   Amoxicillin Rash        Medication List     STOP taking these medications    budesonide-formoterol 160-4.5 MCG/ACT inhaler Commonly known as: SYMBICORT   esomeprazole 40 MG capsule Commonly known as: NEXIUM   levocetirizine 5 MG tablet Commonly known as: XYZAL   lisinopril-hydrochlorothiazide 10-12.5 MG tablet Commonly known as: ZESTORETIC   metoprolol tartrate 25 MG tablet Commonly known as: LOPRESSOR   metoprolol tartrate 50 MG tablet Commonly known as: LOPRESSOR       TAKE these medications    acetaminophen 325 MG tablet Commonly known as: Tylenol Take 2 tablets (650 mg total) by mouth every 8 (eight) hours as needed for mild pain.   albuterol 108 (90 Base) MCG/ACT inhaler Commonly known as: VENTOLIN HFA Inhale 2 puffs into the lungs every 4 (four) hours as needed for wheezing or shortness of breath.   ALPRAZolam 0.25 MG tablet Commonly known as: XANAX Take 0.25 mg by mouth at bedtime as needed for sleep.   aspirin 81 MG tablet Take 1 tablet (81 mg total) by mouth daily. Start taking on: April 21, 2023 What changed: These instructions start on April 21, 2023. If you are unsure what to do until then, ask your doctor or other care provider.   bisacodyl 5 MG EC tablet Commonly known as: Dulcolax Take 1 tablet (5 mg total)  by mouth daily as needed for moderate constipation.   CALCIUM 600 PO Take 1 tablet by mouth daily.   COENZYME Q10 PO Take 2,000 mg by mouth at bedtime.   docusate sodium 100 MG capsule Commonly known as: Colace Take 1 capsule (100 mg total) by mouth 2 (two) times daily as needed for up to 10 days for mild constipation.   fexofenadine 180 MG tablet Commonly known as: ALLEGRA Take 180 mg by mouth daily.   fluticasone 50 MCG/ACT nasal spray Commonly known as: FLONASE Place into both nostrils daily.   folic acid 400 MCG tablet Commonly known as: FOLVITE Take 800 mcg  by mouth daily.   metoprolol succinate 25 MG 24 hr tablet Commonly known as: TOPROL-XL Take 25 mg by mouth every evening.   metoprolol succinate 50 MG 24 hr tablet Commonly known as: TOPROL-XL Take 2 tablets by mouth every morning.   montelukast 10 MG tablet Commonly known as: SINGULAIR Take 10 mg by mouth daily.   oxyCODONE 5 MG immediate release tablet Commonly known as: Roxicodone Take 1 tablet (5 mg total) by mouth every 8 (eight) hours as needed.   pantoprazole 40 MG tablet Commonly known as: PROTONIX Take 40 mg by mouth daily. Reported on 07/31/2015   polyethylene glycol 17 g packet Commonly known as: MiraLax Take 17 g by mouth daily as needed for moderate constipation.   Simethicone 80 MG Tabs Take 1 tablet (80 mg total) by mouth 3 (three) times daily for 5 days.   Trelegy Ellipta 100-62.5-25 MCG/ACT Aepb Generic drug: Fluticasone-Umeclidin-Vilant Inhale 1 puff into the lungs daily.   vitamin B-12 250 MCG tablet Commonly known as: CYANOCOBALAMIN Take 250 mcg by mouth at bedtime.   Vitamin D (Ergocalciferol) 1.25 MG (50000 UNIT) Caps capsule Commonly known as: DRISDOL Take 50,000 Units by mouth every 7 (seven) days.   Vitamin D3 50 MCG (2000 UT) capsule Take 2,000 Units by mouth daily.       Allergies  Allergen Reactions   Alendronate Other (See Comments)    Joint pain   Statins Other (See Comments)    Muscle pain   Amoxicillin Rash    Follow-up Information     Sakai, Isami, DO Follow up in 2 week(s).   Specialties: General Surgery, Surgery Why: post op lap chole Contact information: [address] [place] [address] [phone]         Danella Penton, MD. Schedule an appointment as soon as possible for a visit in 2 week(s).   Specialty: Internal Medicine Contact information: [phone] [facility] [address] [facility] [place] [facility] [place] [address] [phone]                  The results of significant diagnostics  from this hospitalization (including imaging, microbiology, ancillary and laboratory) are listed below for reference.    Significant Diagnostic Studies: DG Chest Portable 1 View  Result Date: 04/11/2023 CLINICAL DATA:  Chest pain EXAM: PORTABLE CHEST 1 VIEW COMPARISON:  None Available. FINDINGS: Underinflation. Enlarged cardiopericardial silhouette with calcified aorta. Basilar atelectasis. Overlapping cardiac leads. Curvature and degenerative changes along the spine. Calcifications along the tracheobronchial tree. IMPRESSION: Enlarged cardiopericardial silhouette. Basilar atelectasis. Underinflation. Electronically Signed   By: Karen Kays M.D.   On: 04/11/2023 18:44   US ABDOMEN LIMITED RUQ (LIVER/GB)  Result Date: 04/11/2023 CLINICAL DATA:  Right upper quadrant pain for 36 hours EXAM: ULTRASOUND ABDOMEN LIMITED RIGHT UPPER QUADRANT COMPARISON:  Chest CT scan with contrast 03/28/2023 FINDINGS: Gallbladder: Dilated  gallbladder with sludge and stones. Borderline wall thickness at 3 mm. The sonographer does report pain when scanning of the gallbladder. No adjacent fluid. Common bile duct: Diameter: 8 mm, within normal limits for patient's age. Liver: Diffusely echogenic hepatic parenchyma consistent with fatty liver infiltration. Portal vein is patent on color Doppler imaging with normal direction of blood flow towards the liver. Other: None. IMPRESSION: Dilated gallbladder with sludge and stones. Borderline wall thickness and there is a positive sonographic Murphy's sign. Possible acute cholecystitis. Please correlate with other clinical symptoms and if needed confirmatory HIDA scan could be considered as clinically appropriate. Fatty liver infiltration. Electronically Signed   By: Karen Kays M.D.   On: 04/11/2023 18:43   CT CHEST W CONTRAST  Result Date: 04/03/2023 CLINICAL DATA:  Possible descending thoracic aortic aneurysm on recent chest radiographs dated 03/14/2023. EXAM: CT CHEST WITH CONTRAST  TECHNIQUE: Multidetector CT imaging of the chest was performed during intravenous contrast administration. RADIATION DOSE REDUCTION: This exam was performed according to the departmental dose-optimization program which includes automated exposure control, adjustment of the mA and/or kV according to patient size and/or use of iterative reconstruction technique. CONTRAST:  80mL OMNIPAQUE IOHEXOL 300 MG/ML  SOLN COMPARISON:  Two-view chest radiographs report dated 03/15/2023 elsewhere. FINDINGS: Cardiovascular: Atheromatous calcifications, including the coronary arteries and aorta. Mildly enlarged heart. No pericardial effusion. The aorta is normal size without aneurysm. The descending thoracic aorta is tortuous due to spinal scoliosis. Mediastinum/Nodes: No enlarged mediastinal, hilar, or axillary lymph nodes. Thyroid gland, trachea, and esophagus demonstrate no significant findings. Lungs/Pleura: Minimal biapical and bibasilar scarring. Small amount of patchy interstitial and linear density in the posteromedial right lower lobe. Upper Abdomen: Mild diffuse low density of the liver. Musculoskeletal: Marked pectus excavatum. Mild-to-moderate levoconvex thoracic scoliosis. IMPRESSION: 1. No thoracic aortic aneurysm. 2. Mild cardiomegaly. 3. Small amount of patchy interstitial and linear density in the posteromedial right lower lobe. This could be due to focal infection/inflammation and/or scarring. 4. Mild hepatic steatosis. 5. Marked pectus excavatum. 6. Mild-to-moderate levoconvex thoracic scoliosis causing descending thoracic aortic tortuosity. 7.  Calcific coronary artery and aortic atherosclerosis. Aortic Atherosclerosis (ICD10-I70.0). Electronically Signed   By: Beckie Salts M.D.   On: 04/03/2023 16:19   MM 3D DIAGNOSTIC MAMMOGRAM BILATERAL BREAST  Result Date: 03/27/2023 CLINICAL DATA:  82 year old female presenting for evaluation of swelling in the right axilla extending into the upper outer right breast.  EXAM: DIGITAL DIAGNOSTIC BILATERAL MAMMOGRAM WITH TOMOSYNTHESIS AND CAD; [place] AXILLARY RIGHT TECHNIQUE: Bilateral digital diagnostic mammography and breast tomosynthesis was performed. The images were evaluated with computer-aided detection. ; Targeted ultrasound examination of the right axilla was performed. COMPARISON:  Previous exam(s). ACR Breast Density Category c: The breasts are heterogeneously dense, which may obscure small masses. FINDINGS: No suspicious calcifications, masses or areas of distortion are seen in the bilateral breasts. Ultrasound targeted to the right axilla demonstrates multiple normal-appearing lymph nodes. Some of these lymph nodes are broad in the long axis dimension, however there cortices are well within normal limits measuring 1-2 mm. Otherwise, normal subcutaneous fatty tissue is noted. No suspicious masses or areas of shadowing are identified. IMPRESSION: 1. No suspicious mammographic or targeted sonographic abnormalities in the palpable area of concern in the right axilla. 2.  No evidence of malignancy in the bilateral breasts. RECOMMENDATION: Screening mammogram in one year.(Code:SM-B-01Y) I have discussed the findings and recommendations with the patient. If applicable, a reminder letter will be sent to the patient regarding the next appointment. BI-RADS CATEGORY  1: Negative. Electronically Signed   By: Frederico Hamman M.D.   On: 03/27/2023 15:17   [place] AXILLA RIGHT  Result Date: 03/27/2023 CLINICAL DATA:  82 year old female presenting for evaluation of swelling in the right axilla extending into the upper outer right breast. EXAM: DIGITAL DIAGNOSTIC BILATERAL MAMMOGRAM WITH TOMOSYNTHESIS AND CAD; [place] AXILLARY RIGHT TECHNIQUE: Bilateral digital diagnostic mammography and breast tomosynthesis was performed. The images were evaluated with computer-aided detection. ; Targeted ultrasound examination of the right axilla was performed. COMPARISON:  Previous exam(s). ACR Breast Density  Category c: The breasts are heterogeneously dense, which may obscure small masses. FINDINGS: No suspicious calcifications, masses or areas of distortion are seen in the bilateral breasts. Ultrasound targeted to the right axilla demonstrates multiple normal-appearing lymph nodes. Some of these lymph nodes are broad in the long axis dimension, however there cortices are well within normal limits measuring 1-2 mm. Otherwise, normal subcutaneous fatty tissue is noted. No suspicious masses or areas of shadowing are identified. IMPRESSION: 1. No suspicious mammographic or targeted sonographic abnormalities in the palpable area of concern in the right axilla. 2.  No evidence of malignancy in the bilateral breasts. RECOMMENDATION: Screening mammogram in one year.(Code:SM-B-01Y) I have discussed the findings and recommendations with the patient. If applicable, a reminder letter will be sent to the patient regarding the next appointment. BI-RADS CATEGORY  1: Negative. Electronically Signed   By: Frederico Hamman M.D.   On: 03/27/2023 15:17    Microbiology: No results found for this or any previous visit (from the past 240 hour(s)).   Labs: Basic Metabolic Panel: Recent Labs  Lab 04/11/23 1527 04/12/23 0651 04/13/23 0515 04/14/23 0537  NA 136 136 133* 138  K 3.8 3.9 4.0 3.7  CL 100 102 98 100  CO2 24 25 25 28   GLUCOSE 126* 205* 167* 115*  BUN 18 21 19 13   CREATININE 0.87 0.96 1.03* 0.88  CALCIUM 9.7 9.1 8.7* 8.8*   Liver Function Tests: Recent Labs  Lab 04/11/23 1527 04/12/23 0651 04/13/23 0515 04/14/23 0537  AST 115* 292* 244* 194*  ALT 58* 182* 212* 221*  ALKPHOS 94 103 88 111  BILITOT 1.5* 3.5* 2.0* 1.7*  PROT 7.8 7.4 6.5 6.4*  ALBUMIN 4.0 3.6 3.3* 3.3*   Recent Labs  Lab 04/11/23 1527  LIPASE 33   No results for input(s): "AMMONIA" in the last 168 hours. CBC: Recent Labs  Lab 04/11/23 1527 04/12/23 0651 04/13/23 0515 04/14/23 0537  WBC 9.9 16.2* 11.7* 7.5  HGB 15.4* 13.6  12.7 13.4  HCT 46.1* 40.2 37.7 40.0  MCV 91.7 90.5 92.4 90.9  PLT 324 260 212 238   Cardiac Enzymes: No results for input(s): "CKTOTAL", "CKMB", "CKMBINDEX", "TROPONINI" in the last 168 hours. BNP: BNP (last 3 results) No results for input(s): "BNP" in the last 8760 hours.  ProBNP (last 3 results) No results for input(s): "PROBNP" in the last 8760 hours.  CBG: No results for input(s): "GLUCAP" in the last 168 hours.     Signed:  Ramiro Harvest MD.  [facility] 04/14/2023, 10:16 AM

## 2023-04-14 NOTE — Plan of Care (Signed)
Problem: Activity: Goal: Ability to tolerate increased activity will improve Outcome: Progressing   Problem: Respiratory: Goal: Ability to maintain a clear airway will improve Outcome: Progressing   Problem: Activity: Goal: Risk for activity intolerance will decrease Outcome: Progressing

## 2023-06-22 ENCOUNTER — Encounter: Payer: Self-pay | Admitting: Emergency Medicine

## 2023-06-22 ENCOUNTER — Emergency Department: Payer: Medicare Other

## 2023-06-22 ENCOUNTER — Inpatient Hospital Stay
Admission: EM | Admit: 2023-06-22 | Discharge: 2023-06-26 | DRG: 445 | Disposition: A | Discharge status: Home / Self Care | Payer: Medicare Other | Attending: Hospitalist | Admitting: Hospitalist

## 2023-06-22 ENCOUNTER — Other Ambulatory Visit: Payer: Self-pay

## 2023-06-22 DIAGNOSIS — E66811 Obesity, class 1: Secondary | ICD-10-CM | POA: Diagnosis present

## 2023-06-22 DIAGNOSIS — I10 Essential (primary) hypertension: Secondary | ICD-10-CM | POA: Diagnosis present

## 2023-06-22 DIAGNOSIS — Z803 Family history of malignant neoplasm of breast: Secondary | ICD-10-CM | POA: Diagnosis not present

## 2023-06-22 DIAGNOSIS — E669 Obesity, unspecified: Secondary | ICD-10-CM | POA: Diagnosis present

## 2023-06-22 DIAGNOSIS — K760 Fatty (change of) liver, not elsewhere classified: Secondary | ICD-10-CM | POA: Diagnosis present

## 2023-06-22 DIAGNOSIS — M810 Age-related osteoporosis without current pathological fracture: Secondary | ICD-10-CM | POA: Diagnosis present

## 2023-06-22 DIAGNOSIS — K219 Gastro-esophageal reflux disease without esophagitis: Secondary | ICD-10-CM | POA: Diagnosis present

## 2023-06-22 DIAGNOSIS — Z9049 Acquired absence of other specified parts of digestive tract: Secondary | ICD-10-CM

## 2023-06-22 DIAGNOSIS — J4489 Other specified chronic obstructive pulmonary disease: Secondary | ICD-10-CM | POA: Diagnosis present

## 2023-06-22 DIAGNOSIS — R1010 Upper abdominal pain, unspecified: Secondary | ICD-10-CM

## 2023-06-22 DIAGNOSIS — R9431 Abnormal electrocardiogram [ECG] [EKG]: Secondary | ICD-10-CM

## 2023-06-22 DIAGNOSIS — J449 Chronic obstructive pulmonary disease, unspecified: Secondary | ICD-10-CM | POA: Diagnosis present

## 2023-06-22 DIAGNOSIS — E872 Acidosis, unspecified: Secondary | ICD-10-CM | POA: Diagnosis present

## 2023-06-22 DIAGNOSIS — Z79899 Other long term (current) drug therapy: Secondary | ICD-10-CM | POA: Diagnosis not present

## 2023-06-22 DIAGNOSIS — I2489 Other forms of acute ischemic heart disease: Secondary | ICD-10-CM | POA: Diagnosis present

## 2023-06-22 DIAGNOSIS — K8050 Calculus of bile duct without cholangitis or cholecystitis without obstruction: Secondary | ICD-10-CM | POA: Diagnosis present

## 2023-06-22 DIAGNOSIS — Z6832 Body mass index (BMI) 32.0-32.9, adult: Secondary | ICD-10-CM | POA: Diagnosis not present

## 2023-06-22 DIAGNOSIS — R17 Unspecified jaundice: Secondary | ICD-10-CM

## 2023-06-22 DIAGNOSIS — Z888 Allergy status to other drugs, medicaments and biological substances status: Secondary | ICD-10-CM | POA: Diagnosis not present

## 2023-06-22 DIAGNOSIS — Z1152 Encounter for screening for COVID-19: Secondary | ICD-10-CM

## 2023-06-22 DIAGNOSIS — B199 Unspecified viral hepatitis without hepatic coma: Secondary | ICD-10-CM | POA: Diagnosis present

## 2023-06-22 DIAGNOSIS — M419 Scoliosis, unspecified: Secondary | ICD-10-CM | POA: Diagnosis present

## 2023-06-22 DIAGNOSIS — Z7982 Long term (current) use of aspirin: Secondary | ICD-10-CM | POA: Diagnosis not present

## 2023-06-22 DIAGNOSIS — Z880 Allergy status to penicillin: Secondary | ICD-10-CM | POA: Diagnosis not present

## 2023-06-22 DIAGNOSIS — R7401 Elevation of levels of liver transaminase levels: Principal | ICD-10-CM

## 2023-06-22 LAB — URINALYSIS, W/ REFLEX TO CULTURE (INFECTION SUSPECTED)
Bacteria, UA: NONE SEEN
Bilirubin Urine: NEGATIVE
Glucose, UA: NEGATIVE mg/dL
Hgb urine dipstick: NEGATIVE
Ketones, ur: NEGATIVE mg/dL
Leukocytes,Ua: NEGATIVE
Nitrite: NEGATIVE
Protein, ur: NEGATIVE mg/dL
Specific Gravity, Urine: 1.03 (ref 1.005–1.030)
pH: 7 (ref 5.0–8.0)

## 2023-06-22 LAB — CBC
HCT: 44 % (ref 36.0–46.0)
Hemoglobin: 14.9 g/dL (ref 12.0–15.0)
MCH: 30.7 pg (ref 26.0–34.0)
MCHC: 33.9 g/dL (ref 30.0–36.0)
MCV: 90.5 fL (ref 80.0–100.0)
Platelets: 262 10*3/uL (ref 150–400)
RBC: 4.86 MIL/uL (ref 3.87–5.11)
RDW: 12.1 % (ref 11.5–15.5)
WBC: 10 10*3/uL (ref 4.0–10.5)
nRBC: 0 % (ref 0.0–0.2)

## 2023-06-22 LAB — URINALYSIS, ROUTINE W REFLEX MICROSCOPIC
Bilirubin Urine: NEGATIVE
Glucose, UA: NEGATIVE mg/dL
Hgb urine dipstick: NEGATIVE
Ketones, ur: NEGATIVE mg/dL
Leukocytes,Ua: NEGATIVE
Nitrite: NEGATIVE
Protein, ur: NEGATIVE mg/dL
Specific Gravity, Urine: 1.015 (ref 1.005–1.030)
pH: 7 (ref 5.0–8.0)

## 2023-06-22 LAB — COMPREHENSIVE METABOLIC PANEL
ALT: 225 U/L — ABNORMAL HIGH (ref 0–44)
AST: 511 U/L — ABNORMAL HIGH (ref 15–41)
Albumin: 3.8 g/dL (ref 3.5–5.0)
Alkaline Phosphatase: 264 U/L — ABNORMAL HIGH (ref 38–126)
Anion gap: 13 (ref 5–15)
BUN: 20 mg/dL (ref 8–23)
CO2: 24 mmol/L (ref 22–32)
Calcium: 9.8 mg/dL (ref 8.9–10.3)
Chloride: 98 mmol/L (ref 98–111)
Creatinine, Ser: 0.98 mg/dL (ref 0.44–1.00)
GFR, Estimated: 58 mL/min — ABNORMAL LOW (ref >60)
Glucose, Bld: 146 mg/dL — ABNORMAL HIGH (ref 70–99)
Potassium: 3.6 mmol/L (ref 3.5–5.1)
Sodium: 135 mmol/L (ref 135–145)
Total Bilirubin: 2.4 mg/dL — ABNORMAL HIGH (ref <1.2)
Total Protein: 7.7 g/dL (ref 6.5–8.1)

## 2023-06-22 LAB — RESP PANEL BY RT-PCR (RSV, FLU A&B, COVID)  RVPGX2
Influenza A by PCR: NEGATIVE
Influenza B by PCR: NEGATIVE
Resp Syncytial Virus by PCR: NEGATIVE
SARS Coronavirus 2 by RT PCR: NEGATIVE

## 2023-06-22 LAB — LACTIC ACID, PLASMA
Lactic Acid, Venous: 2.9 mmol/L (ref 0.5–1.9)
Lactic Acid, Venous: 2.8 mmol/L (ref 0.5–1.9)

## 2023-06-22 LAB — ACETAMINOPHEN LEVEL: Acetaminophen (Tylenol), Serum: 14 ug/mL (ref 10–30)

## 2023-06-22 LAB — PROTIME-INR
INR: 1.1 (ref 0.8–1.2)
Prothrombin Time: 14.5 s (ref 11.4–15.2)

## 2023-06-22 LAB — IRON AND TIBC
Iron: 34 ug/dL (ref 28–170)
Saturation Ratios: 9 % — ABNORMAL LOW (ref 10.4–31.8)
TIBC: 372 ug/dL (ref 250–450)
UIBC: 338 ug/dL

## 2023-06-22 LAB — TROPONIN I (HIGH SENSITIVITY)
Troponin I (High Sensitivity): 44 ng/L — ABNORMAL HIGH (ref <18)
Troponin I (High Sensitivity): 63 ng/L — ABNORMAL HIGH (ref <18)

## 2023-06-22 LAB — HEPATITIS PANEL, ACUTE
HCV Ab: NONREACTIVE
Hep A IgM: NONREACTIVE
Hep B C IgM: NONREACTIVE
Hepatitis B Surface Ag: NONREACTIVE

## 2023-06-22 LAB — SALICYLATE LEVEL: Salicylate Lvl: <7 mg/dL — ABNORMAL LOW (ref 7.0–30.0)

## 2023-06-22 LAB — MAGNESIUM: Magnesium: 1.9 mg/dL (ref 1.7–2.4)

## 2023-06-22 LAB — LIPASE, BLOOD: Lipase: 25 U/L (ref 11–51)

## 2023-06-22 LAB — FERRITIN: Ferritin: 312 ng/mL — ABNORMAL HIGH (ref 11–307)

## 2023-06-22 MED ORDER — LACTATED RINGERS IV SOLN: INTRAVENOUS | Status: DC

## 2023-06-22 MED ORDER — ACETAMINOPHEN 325 MG PO TABS
650.0000 mg | ORAL_TABLET | Freq: Four times a day (QID) | ORAL | Status: DC | PRN
  Administered 2023-06-22: 650 mg via ORAL
  Filled 2023-06-22 (×2): qty 2

## 2023-06-22 MED ORDER — SODIUM CHLORIDE 0.9 % IV BOLUS
500.0000 mL | Freq: Once | INTRAVENOUS | Status: AC
  Administered 2023-06-22: 500 mL via INTRAVENOUS

## 2023-06-22 MED ORDER — METRONIDAZOLE 500 MG/100ML IV SOLN
500.0000 mg | Freq: Two times a day (BID) | INTRAVENOUS | Status: DC
  Administered 2023-06-23 – 2023-06-25 (×5): 500 mg via INTRAVENOUS
  Filled 2023-06-22 (×6): qty 100

## 2023-06-22 MED ORDER — ACETAMINOPHEN 650 MG RE SUPP: 650.0000 mg | Freq: Four times a day (QID) | RECTAL | Status: DC | PRN

## 2023-06-22 MED ORDER — METRONIDAZOLE 500 MG/100ML IV SOLN
500.0000 mg | Freq: Once | INTRAVENOUS | Status: AC
  Administered 2023-06-22: 500 mg via INTRAVENOUS
  Filled 2023-06-22: qty 100

## 2023-06-22 MED ORDER — MORPHINE SULFATE (PF) 2 MG/ML IV SOLN: 2.0000 mg | INTRAVENOUS | Status: DC | PRN

## 2023-06-22 MED ORDER — KETOROLAC TROMETHAMINE 30 MG/ML IJ SOLN: 30.0000 mg | Freq: Four times a day (QID) | INTRAMUSCULAR | Status: DC | PRN

## 2023-06-22 MED ORDER — LACTATED RINGERS IV SOLN: INTRAVENOUS | Status: AC

## 2023-06-22 MED ORDER — ACETAMINOPHEN 325 MG PO TABS
650.0000 mg | ORAL_TABLET | Freq: Once | ORAL | Status: AC
  Administered 2023-06-22: 650 mg via ORAL
  Filled 2023-06-22: qty 2

## 2023-06-22 MED ORDER — ALPRAZOLAM 0.25 MG PO TABS
0.2500 mg | ORAL_TABLET | Freq: Every evening | ORAL | Status: DC | PRN
  Administered 2023-06-24: 0.25 mg via ORAL
  Filled 2023-06-22 (×2): qty 1

## 2023-06-22 MED ORDER — ALBUTEROL SULFATE (2.5 MG/3ML) 0.083% IN NEBU: 2.5000 mg | INHALATION_SOLUTION | RESPIRATORY_TRACT | Status: DC | PRN

## 2023-06-22 MED ORDER — FLUTICASONE FUROATE-VILANTEROL 100-25 MCG/ACT IN AEPB
1.0000 | INHALATION_SPRAY | Freq: Every day | RESPIRATORY_TRACT | Status: DC
  Administered 2023-06-23 – 2023-06-25 (×3): 1 via RESPIRATORY_TRACT
  Filled 2023-06-22: qty 28

## 2023-06-22 MED ORDER — SODIUM CHLORIDE 0.9 % IV SOLN
2.0000 g | Freq: Once | INTRAVENOUS | Status: AC
  Administered 2023-06-22: 2 g via INTRAVENOUS
  Filled 2023-06-22: qty 12.5

## 2023-06-22 MED ORDER — HYDRALAZINE HCL 20 MG/ML IJ SOLN
5.0000 mg | INTRAMUSCULAR | Status: DC | PRN
  Administered 2023-06-22 – 2023-06-25 (×3): 5 mg via INTRAVENOUS
  Filled 2023-06-22 (×3): qty 1

## 2023-06-22 MED ORDER — PROCHLORPERAZINE EDISYLATE 10 MG/2ML IJ SOLN
10.0000 mg | INTRAMUSCULAR | Status: DC | PRN
  Administered 2023-06-24: 10 mg via INTRAVENOUS
  Filled 2023-06-22: qty 2

## 2023-06-22 MED ORDER — IOHEXOL 350 MG/ML SOLN
75.0000 mL | Freq: Once | INTRAVENOUS | Status: AC | PRN
  Administered 2023-06-22: 80 mL via INTRAVENOUS

## 2023-06-22 MED ORDER — SODIUM CHLORIDE 0.9 % IV SOLN
2.0000 g | Freq: Two times a day (BID) | INTRAVENOUS | Status: DC
  Administered 2023-06-23 – 2023-06-25 (×5): 2 g via INTRAVENOUS
  Filled 2023-06-22 (×6): qty 12.5

## 2023-06-22 MED ORDER — LORAZEPAM 2 MG/ML IJ SOLN
0.5000 mg | Freq: Once | INTRAMUSCULAR | Status: AC | PRN
  Administered 2023-06-22: 0.5 mg via INTRAVENOUS
  Filled 2023-06-22: qty 1

## 2023-06-22 MED ORDER — UMECLIDINIUM BROMIDE 62.5 MCG/ACT IN AEPB
1.0000 | INHALATION_SPRAY | Freq: Every day | RESPIRATORY_TRACT | Status: DC
  Administered 2023-06-23 – 2023-06-25 (×3): 1 via RESPIRATORY_TRACT
  Filled 2023-06-22: qty 7

## 2023-06-22 NOTE — Assessment & Plan Note (Signed)
Avoid QT prolonging drugs  

## 2023-06-22 NOTE — H&P (Signed)
History and Physical    Patient: Janet Rios:[id number] DOB: 06/29/1941 DOA: 06/22/2023 DOS: the patient was seen and examined on 06/22/2023 PCP: Danella Penton, MD  Patient coming from: Home  Chief Complaint:  Chief Complaint  Patient presents with   Weakness    HPI: Janet Rios is a 82 y.o. female with medical history significant for HTN, COPD who is s/p cholecystectomy 04/12/2023, who presents to the ED With a 2-day history of low-grade fevers, nausea, and post prandial right upper quadrant pain radiating to her back.  She had 2 episodes of nonbloody emesis the day prior.  She denies cough or shortness of breath.  Denies dysuria, diarrhea or constipation. ED course and data review: BP 179/85 with pulse 109 and tachycardic to the mid 20s Labs: WBC normal at 10,000 but with lactic acid 2.9-2.8 Lipase 25 but LFTs elevated with AST 511, ALT 225, alk phos 264 and total bilirubin 2.4. UA with no evidence of UTI and respiratory viral panel negative. EKG, personally viewed and interpreted showing sinus tachycardia at 112 with no acute ST-T wave changes.  Does show QT prolongation. Patient had an extensive workup in the ED that included chest x-ray CTA PE protocol, CT abdomen and pelvis and subsequently MRCP with final diagnosis of choledocholithiasis and fatty liver as follows: IMPRESSION: Choledocholithiasis with numerous stones along the course of the common duct measuring up to 12 mm. The common duct is ectatic at 12 mm but does taper towards the pancreatic head. Only slight intrahepatic biliary ductal dilatation particularly along the left side. Prior cholecystectomy. Simple appearing areas of fluid or cystic lesion in the right pericolic gutter as seen on CT. Please correlate with the history. Fatty liver infiltration with slightly nodular contour.   The ED provider spoke with on-call gastroenterologist, Dr. Timothy Lasso who requests that patient be placed n.p.o. for possible ERCP  in the a.m. Patient was started on cefepime and Flagyl and given a small IV fluid bolus Hospitalist consulted for admission.    Review of Systems: As mentioned in the history of present illness. All other systems reviewed and are negative.  Past Medical History:  Diagnosis Date   Arthritis    Asthma    Ashtma without status asthmaticus   COPD (chronic obstructive pulmonary disease) (HCC)    Dyspnea    Esophageal stricture    Fatty liver    GERD (gastroesophageal reflux disease)    Hypertension    Lipids serum increased    Obesity    Osteoporosis    Scoliosis    Trigger finger, acquired    Vertigo    no episodes in over 1 yr   Vitamin D deficiency    Past Surgical History:  Procedure Laterality Date   ADENTAMOUS POLYP     APPENDECTOMY     BREAST CYST ASPIRATION Left    CATARACT EXTRACTION W/PHACO Right 11/01/2017   Procedure: CATARACT EXTRACTION PHACO AND INTRAOCULAR LENS PLACEMENT (IOC) RIGHT;  Surgeon: Lockie Mola, MD;  Location: [facility];  Service: Ophthalmology;  Laterality: Right;   CATARACT EXTRACTION W/PHACO Left 01/10/2018   Procedure: CATARACT EXTRACTION PHACO AND INTRAOCULAR LENS PLACEMENT (IOC)  LEFT;  Surgeon: Lockie Mola, MD;  Location: [facility];  Service: Ophthalmology;  Laterality: Left;   COLON SURGERY     COLONOSCOPY     COLONOSCOPY WITH PROPOFOL N/A 07/31/2015   Procedure: COLONOSCOPY WITH PROPOFOL;  Surgeon: Scot Jun, MD;  Location: [facility];  Service: Endoscopy;  Laterality: N/A;  COLONOSCOPY WITH PROPOFOL N/A 12/11/2015   Procedure: COLONOSCOPY WITH PROPOFOL;  Surgeon: Scot Jun, MD;  [facility];  Service: Endoscopy;  Laterality: N/A;   COLONOSCOPY WITH PROPOFOL N/A 12/16/2020   Procedure: COLONOSCOPY WITH PROPOFOL;  Surgeon: Toledo, Boykin Nearing, MD;  Location: [facility] ENDOSCOPY;  Service: Gastroenterology;  Laterality: N/A;   ESOPHAGEAL DILITATION     HERNIA REPAIR     umbilical   rt  ELBOW SURGERY     TUBAL LIGATION     UMBILICAL HERNIA REPAIR     Social History:  reports that she has never smoked. She has never used smokeless tobacco. She reports that she does not drink alcohol and does not use drugs.  Allergies  Allergen Reactions   Alendronate Other (See Comments)    Joint pain   Statins Other (See Comments)    Muscle pain   Amoxicillin Rash    Family History  Problem Relation Age of Onset   Breast cancer Maternal Aunt 76   Breast cancer Cousin     Prior to Admission medications   Medication Sig Start Date End Date Taking? Authorizing Provider  albuterol (PROVENTIL HFA;VENTOLIN HFA) 108 (90 Base) MCG/ACT inhaler Inhale 2 puffs into the lungs every 4 (four) hours as needed for wheezing or shortness of breath.    [provider]  ALPRAZolam Prudy Feeler) 0.25 MG tablet Take 0.25 mg by mouth at bedtime as needed for sleep.    [provider]  aspirin 81 MG tablet Take 1 tablet (81 mg total) by mouth daily. 04/21/23   Rodolph Bong, MD  bisacodyl (DULCOLAX) 5 MG EC tablet Take 1 tablet (5 mg total) by mouth daily as needed for moderate constipation. 04/14/23 04/13/24  Rodolph Bong, MD  Calcium Carbonate (CALCIUM 600 PO) Take 1 tablet by mouth daily.    [provider]  Cholecalciferol (VITAMIN D3) 2000 units capsule Take 2,000 Units by mouth daily.    [provider]  COENZYME Q10 PO Take 2,000 mg by mouth at bedtime.    [provider]  fexofenadine (ALLEGRA) 180 MG tablet Take 180 mg by mouth daily.    [provider]  fluticasone (FLONASE) 50 MCG/ACT nasal spray Place into both nostrils daily.    [provider]  folic acid (FOLVITE) 400 MCG tablet Take 800 mcg by mouth daily.    [provider]  metoprolol succinate (TOPROL-XL) 25 MG 24 hr tablet Take 25 mg by mouth every evening.    [provider]  metoprolol succinate (TOPROL-XL) 50 MG 24 hr tablet Take 2 tablets by mouth  every morning. 01/29/15   [provider]  montelukast (SINGULAIR) 10 MG tablet Take 10 mg by mouth daily.    [provider]  oxyCODONE (ROXICODONE) 5 MG immediate release tablet Take 1 tablet (5 mg total) by mouth every 8 (eight) hours as needed. 04/13/23 04/12/24  Tonna Boehringer, Isami, DO  pantoprazole (PROTONIX) 40 MG tablet Take 40 mg by mouth daily. Reported on 07/31/2015    [provider]  polyethylene glycol (MIRALAX) 17 g packet Take 17 g by mouth daily as needed for moderate constipation. 04/14/23   Rodolph Bong, MD  Simethicone 80 MG TABS Take 1 tablet (80 mg total) by mouth 3 (three) times daily for 5 days. 04/14/23 04/19/23  Rodolph Bong, MD  TRELEGY ELLIPTA 100-62.5-25 MCG/ACT AEPB Inhale 1 puff into the lungs daily. 03/14/23   [provider]  vitamin B-12 (CYANOCOBALAMIN) 250 MCG tablet  Take 250 mcg by mouth at bedtime.    [provider]  Vitamin D, Ergocalciferol, (DRISDOL) 50000 units CAPS capsule Take 50,000 Units by mouth every 7 (seven) days.    [provider]    Physical Exam: Vitals:   06/22/23 1400 06/22/23 1500 06/22/23 1830 06/22/23 2000  BP: (!) 132/58 (!) 130/54 (!) 151/64 (!) 167/85  Pulse: 82 84 81 87  Resp: (!) 27 (!) 26 (!) 25 (!) 28  Temp:      TempSrc:      SpO2: 92% 91% 93% 93%  Weight:      Height:       Physical Exam Vitals and nursing note reviewed.  Constitutional:      General: She is not in acute distress. HENT:     Head: Normocephalic and atraumatic.  Cardiovascular:     Rate and Rhythm: Normal rate and regular rhythm.     Heart sounds: Normal heart sounds.  Pulmonary:     Effort: Tachypnea present.     Breath sounds: Normal breath sounds.  Abdominal:     Palpations: Abdomen is soft.     Tenderness: There is no abdominal tenderness.  Neurological:     Mental Status: Mental status is at baseline.     Labs on Admission: I have personally reviewed following labs and imaging  studies  CBC: Recent Labs  Lab 06/22/23 0827  WBC 10.0  HGB 14.9  HCT 44.0  MCV 90.5  PLT 262   Basic Metabolic Panel: Recent Labs  Lab 06/22/23 0827 06/22/23 1023  NA 135  --   K 3.6  --   CL 98  --   CO2 24  --   GLUCOSE 146*  --   BUN 20  --   CREATININE 0.98  --   CALCIUM 9.8  --   MG  --  1.9   GFR: Estimated Creatinine Clearance: 42.3 mL/min (by C-G formula based on SCr of 0.98 mg/dL). Liver Function Tests: Recent Labs  Lab 06/22/23 0827  AST 511*  ALT 225*  ALKPHOS 264*  BILITOT 2.4*  PROT 7.7  ALBUMIN 3.8   Recent Labs  Lab 06/22/23 1611  LIPASE 25   No results for input(s): "AMMONIA" in the last 168 hours. Coagulation Profile: Recent Labs  Lab 06/22/23 1611  INR 1.1   Cardiac Enzymes: No results for input(s): "CKTOTAL", "CKMB", "CKMBINDEX", "TROPONINI" in the last 168 hours. BNP (last 3 results) No results for input(s): "PROBNP" in the last 8760 hours. HbA1C: No results for input(s): "HGBA1C" in the last 72 hours. CBG: No results for input(s): "GLUCAP" in the last 168 hours. Lipid Profile: No results for input(s): "CHOL", "HDL", "LDLCALC", "TRIG", "CHOLHDL", "LDLDIRECT" in the last 72 hours. Thyroid Function Tests: No results for input(s): "TSH", "T4TOTAL", "FREET4", "T3FREE", "THYROIDAB" in the last 72 hours. Anemia Panel: Recent Labs    06/22/23 1611  FERRITIN 312*  TIBC 372  IRON 34   Urine analysis:    Component Value Date/Time   COLORURINE YELLOW (A) 06/22/2023 1216   APPEARANCEUR CLEAR (A) 06/22/2023 1216   LABSPEC 1.030 06/22/2023 1216   PHURINE 7.0 06/22/2023 1216   GLUCOSEU NEGATIVE 06/22/2023 1216   HGBUR NEGATIVE 06/22/2023 1216   BILIRUBINUR NEGATIVE 06/22/2023 1216   KETONESUR NEGATIVE 06/22/2023 1216   PROTEINUR NEGATIVE 06/22/2023 1216   NITRITE NEGATIVE 06/22/2023 1216   LEUKOCYTESUR NEGATIVE 06/22/2023 1216    Radiological Exams on Admission: MR 3D Recon At Scanner  Result Date: 06/22/2023 CLINICAL  DATA:  Nonspecific (abnormal) findings on radiological and other examination of musculoskeletal system biliary tree EXAM: 3-DIMENSIONAL MR IMAGE RENDERING ON ACQUISITION WORKSTATION TECHNIQUE: 3-dimensional MR images were rendered by post-processing of the original MR data on an acquisition workstation. The 3-dimensional MR images were interpreted and findings were reported in the accompanying complete MR report for this study COMPARISON:  CT abdomen pelvis 06/22/2023 earlier. Ultrasound 04/11/2023. Chest CT 03/28/2023. FINDINGS: Lower chest: Pectus excavatum seen. The heart enlarged. No significant pleural effusion the lung bases. Bandlike linear changes at the bases as well. Scar or atelectasis. Please correlate with prior CT. Hepatobiliary: The liver has patchy areas of signal drop on out of phase imaging consistent with components of fatty infiltration. Slightly nodular contours of the liver as well. No areas of restricted diffusion in the liver. No abnormal T2 signal. Gallbladder is absent. The common duct measures up to 12 mm in diameter. Slight left-sided intrahepatic biliary ductal dilatation. There are several filling defects along the course of the common duct consistent with choledocholithiasis. At least 5 lesions are seen. There is some mild edema in the porta hepatis. Largest measures up to 12 mm. The duct does taper towards the pancreatic head. Pancreas: Moderate atrophy of the pancreas. No significant pancreatic ductal dilatation. No restricted diffusion. Spleen: Within normal limits in size and appearance. Small anterior splenule. Adrenals/Urinary Tract: Adrenal glands are preserved. Mild bilateral renal atrophy. Nonspecific perinephric stranding. No obvious mass on this noncontrast examination. Stomach/Bowel: Visualized portions of the bowel are nondilated. Third portion duodenal diverticula. Additional small bowel diverticula more distally in the midabdomen. Vascular/Lymphatic: Circumaortic left  renal vein. Normal caliber aorta and IVC. No specific abnormal lymph node enlargement identified in the visualized abdomen. Other: No significant ascites. The cystic area in the right pericolic gutter is again identified and appears simple by noncontrast MRI. Cephalocaudal length of 4.3 cm. Musculoskeletal: Curvature and degenerative changes along the spine. Motion seen throughout the examination. IMPRESSION: Choledocholithiasis with numerous stones along the course of the common duct measuring up to 12 mm. The common duct is ectatic at 12 mm but does taper towards the pancreatic head. Only slight intrahepatic biliary ductal dilatation particularly along the left side. Prior cholecystectomy. Simple appearing areas of fluid or cystic lesion in the right pericolic gutter as seen on CT. Please correlate with the history. Fatty liver infiltration with slightly nodular contour. Evaluation limited by motion. Electronically Signed   By: Karen Kays M.D.   On: 06/22/2023 21:22   MR ABDOMEN MRCP WO CONTRAST  Result Date: 06/22/2023 CLINICAL DATA:  Worsening abdominal pain.  Weakness. EXAM: MRI ABDOMEN WITHOUT CONTRAST  (INCLUDING MRCP) TECHNIQUE: Multiplanar multisequence MR imaging of the abdomen was performed. Heavily T2-weighted images of the biliary and pancreatic ducts were obtained, and three-dimensional MRCP images were rendered by post processing. COMPARISON:  CT abdomen pelvis 06/22/2023 earlier. Ultrasound 04/11/2023. Chest CT 03/28/2023. FINDINGS: Lower chest: Pectus excavatum seen. The heart enlarged. No significant pleural effusion the lung bases. Bandlike linear changes at the bases as well. Scar or atelectasis. Please correlate with prior CT. Hepatobiliary: The liver has patchy areas of signal drop on out of phase imaging consistent with components of fatty infiltration. Slightly nodular contours of the liver as well. No areas of restricted diffusion in the liver. No abnormal T2 signal. Gallbladder is  absent. The common duct measures up to 12 mm in diameter. Slight left-sided intrahepatic biliary ductal dilatation. There are several filling defects along the course of the common duct consistent with  choledocholithiasis. At least 5 lesions are seen. There is some mild edema in the porta hepatis. Largest measures up to 12 mm. The duct does taper towards the pancreatic head. Pancreas: Moderate atrophy of the pancreas. No significant pancreatic ductal dilatation. No restricted diffusion. Spleen: Within normal limits in size and appearance. Small anterior splenule. Adrenals/Urinary Tract: Adrenal glands are preserved. Mild bilateral renal atrophy. Nonspecific perinephric stranding. No obvious mass on this noncontrast examination. Stomach/Bowel: Visualized portions of the bowel are nondilated. Third portion duodenal diverticula. Additional small bowel diverticula more distally in the midabdomen. Vascular/Lymphatic: Circumaortic left renal vein. Normal caliber aorta and IVC. No specific abnormal lymph node enlargement identified in the visualized abdomen. Other: No significant ascites. The cystic area in the right pericolic gutter is again identified and appears simple by noncontrast MRI. Cephalocaudal length of 4.3 cm. Musculoskeletal: Curvature and degenerative changes along the spine. Motion seen throughout the examination. IMPRESSION: Choledocholithiasis with numerous stones along the course of the common duct measuring up to 12 mm. The common duct is ectatic at 12 mm but does taper towards the pancreatic head. Only slight intrahepatic biliary ductal dilatation particularly along the left side. Prior cholecystectomy. Simple appearing areas of fluid or cystic lesion in the right pericolic gutter as seen on CT. Please correlate with the history. Fatty liver infiltration with slightly nodular contour. Evaluation limited by motion. Electronically Signed   By: Karen Kays M.D.   On: 06/22/2023 21:18   CT ABDOMEN  PELVIS W CONTRAST  Result Date: 06/22/2023 CLINICAL DATA:  Biliary obstruction suspected. Generalized weakness. Vomiting. EXAM: CT ABDOMEN AND PELVIS WITH CONTRAST TECHNIQUE: Multidetector CT imaging of the abdomen and pelvis was performed using the standard protocol following bolus administration of intravenous contrast. RADIATION DOSE REDUCTION: This exam was performed according to the departmental dose-optimization program which includes automated exposure control, adjustment of the mA and/or kV according to patient size and/or use of iterative reconstruction technique. CONTRAST:  80mL OMNIPAQUE IOHEXOL 350 MG/ML SOLN COMPARISON:  None Available. FINDINGS: Lower chest: Please refer to the same-day CT chest for description of intrathoracic findings. Hepatobiliary: No focal liver abnormality is seen. Status post cholecystectomy. Prominence of the common bile duct measuring up to 9 mm (series 6, image 44). There is hyperdense intraluminal material within the common bile duct. No intrahepatic biliary dilatation. Pancreas: Atrophic pancreas. No pancreatic ductal dilation or surrounding inflammatory changes. Spleen: Normal in size without focal abnormality. Adrenals/Urinary Tract: The adrenal glands are unremarkable. Mild left renal cortical atrophy. No suspicious focal lesion. No renal calculi or hydronephrosis. Bladder is unremarkable. Stomach/Bowel: Stomach is within normal limits. Appendix is surgically absent. No evidence of bowel wall thickening, distention, or inflammatory changes. Vascular/Lymphatic: The abdominal aorta is normal in caliber. Aortic atherosclerosis. No enlarged abdominal or pelvic lymph nodes. Reproductive: Uterus and bilateral adnexa are unremarkable. Other: Trace free fluid along the right paracolic gutter (series 3, image 51). No free air. Musculoskeletal: No acute osseous abnormality. No suspicious osseous lesion. Multilevel degenerative changes of the lumbar spine. IMPRESSION: 1. Mildly  dilated common bile duct with hyperdense intraluminal material, which could represent small stones, concerning for choledocholithiasis. No intrahepatic biliary ductal dilatation. Further evaluation with ERCP and/or MRCP is recommended. 2. Status post cholecystectomy. 3. Trace free fluid along the right paracolic gutter may be reactive. 4.  Aortic Atherosclerosis (ICD10-I70.0). Electronically Signed   By: Hart Robinsons M.D.   On: 06/22/2023 13:24   CT Angio Chest PE W and/or Wo Contrast  Result Date: 06/22/2023 CLINICAL DATA:  Pulmonary embolism (PE) suspected, high prob RUQ vs R lower rib pain. EXAM: CT ANGIOGRAPHY CHEST WITH CONTRAST TECHNIQUE: Multidetector CT imaging of the chest was performed using the standard protocol during bolus administration of intravenous contrast. Multiplanar CT image reconstructions and MIPs were obtained to evaluate the vascular anatomy. RADIATION DOSE REDUCTION: This exam was performed according to the departmental dose-optimization program which includes automated exposure control, adjustment of the mA and/or kV according to patient size and/or use of iterative reconstruction technique. CONTRAST:  80mL OMNIPAQUE IOHEXOL 350 MG/ML SOLN COMPARISON:  CT chest dated March 28, 2023. FINDINGS: Cardiovascular: Satisfactory opacification of the pulmonary arteries to the segmental level. No evidence of pulmonary embolism. The heart is mildly enlarged. No pericardial effusion. Multivessel coronary artery calcifications. Thoracic aorta is normal in caliber. Tortuosity of the descending thoracic aorta. Aortic atherosclerosis. Mediastinum/Nodes: No enlarged mediastinal, hilar, or axillary lymph nodes. Thyroid gland, trachea, and esophagus demonstrate no significant findings. Lungs/Pleura: Mild bibasilar linear atelectasis/scarring. Similar patchy interstitial changes in the posteromedial right lower lobe favored to represent atelectasis and/or scarring. No focal consolidation. No  suspicious pulmonary nodule. No pleural effusion or pneumothorax. Upper Abdomen: Please refer to the same-day CT abdomen/pelvis for description of intra-abdominal findings. Musculoskeletal: Pectus excavatum. 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 No acute osseous abnormality. No suspicious osseous lesion. Review of the MIP images confirms the above findings. IMPRESSION: 1. No evidence of acute pulmonary embolism. 2. No acute intrathoracic findings. 3. Pectus excavatum. 4.  Aortic Atherosclerosis (ICD10-I70.0). Electronically Signed   By: Hart Robinsons M.D.   On: 06/22/2023 12:59   DG Chest 2 View  Result Date: 06/22/2023 CLINICAL DATA:  Weakness. EXAM: CHEST - 2 VIEW COMPARISON:  04/11/2023. FINDINGS: Low lung volume. Redemonstration of linear area of atelectasis and/or scarring at the right lung base. Bilateral lung fields are otherwise clear. No acute consolidation or lung collapse. Bilateral costophrenic angles are clear. Stable moderately enlarged cardio-mediastinal silhouette which is likely accentuated by low lung volume and AP technique. No acute osseous abnormalities. The soft tissues are within normal limits. IMPRESSION: *No active cardiopulmonary disease. Electronically Signed   By: Jules Schick M.D.   On: 06/22/2023 09:54     Data Reviewed: Relevant notes from primary care and specialist visits, past discharge summaries as available in EHR, including Care Everywhere. Prior diagnostic testing as pertinent to current admission diagnoses Updated medications and problem lists for reconciliation ED course, including vitals, labs, imaging, treatment and response to treatment Triage notes, nursing and pharmacy notes and ED provider's notes Notable results as noted in HPI   Assessment and Plan: * Choledocholithiasis S/p laparoscopic cholecystectomy 04/12/2023 Fatty liver N.p.o. from midnight for ERCP in the a.m. per gastroenterologist Dr.  Leonarda Salon- procedure should be available Pain control, antiemetics, hydration Continue cefepime and Flagyl  Prolonged QT interval Avoid QT prolonging drugs  Chronic obstructive pulmonary disease (COPD) (HCC) Not acutely exacerbated Continue home inhalers with DuoNebs as needed  Hypertension BP elevated, likely related to pain Pain control Hydralazine IV as needed while n.p.o.        DVT prophylaxis:SCD  Consults: Gastroenterology, Dr Timothy Lasso  Advance Care Planning:   Code Status: Prior   Family Communication: none  Disposition Plan: Back to previous home environment  Severity of Illness: The appropriate patient status for this patient is INPATIENT. Inpatient status is judged to be reasonable and necessary in order to provide the required intensity of service to ensure the patient's safety. The patient's presenting symptoms, physical  exam findings, and initial radiographic and laboratory data in the context of their chronic comorbidities is felt to place them at high risk for further clinical deterioration. Furthermore, it is not anticipated that the patient will be medically stable for discharge from the hospital within 2 midnights of admission.   * I certify that at the point of admission it is my clinical judgment that the patient will require inpatient hospital care spanning beyond 2 midnights from the point of admission due to high intensity of service, high risk for further deterioration and high frequency of surveillance required.*  Author: Andris Baumann, MD 06/22/2023 9:57 PM  For on call review [email or web address].

## 2023-06-22 NOTE — Consult Note (Signed)
GI Inpatient Consult Note  Reason for Consult:acute abdominal pain, transaminitis s/p cholecystectomy (04/12/2023) concern for choledocholithiasis    Attending Requesting Consult: Dr. Sharyn Creamer  History of Present Illness: Janet Rios is a 82 y.o. female seen for evaluation of acute abdominal pain, transaminitis s/p cholecystectomy (04/12/2023) concern for choledocholithiasis at the request of Dr. Sharyn Creamer. Patient has a PMH of HTN, COPD, acute cholecystitis.  Patient presented to the [facility] ED for chief complaint of upper abdominal pain, bloating, N/V, weakness, chills x2 days. Upon presentation to the ED, vital signs were 179/85, 109, 18, 99.2, SpO2 93% Labs were significant for Gluc 146, Na 135, K 3.6, BUN 20, Cr 0.98, ALKP 264, albumin 3.8, AST 511, ALT 225, TB 2.4, eGFR 58, troponin 44-63, Lactic acid 2.8, WBC 10.0, Hgb 14.9, PLT 262.Bld cx x 2 pending.  Imaging studies: CT A/P :  Mildly dilated common bile duct with hyperdense intraluminal material, which could represent small stones, concerning for choledocholithiasis. No intrahepatic biliary ductal dilatation.Further evaluation with ERCP and/or MRCP is recommended. 2. Status post cholecystectomy. 3. Trace free fluid along the right paracolic gutter may be reactive. CXR no acute findings.  Patient reports that after her cholecystectomy in September she did well. Her pain resolved and her diarrhea that resolved to more regular formed stools.  Onset 2 days ago upper abdomen pain underneath her breast with radiation into the back.  This is a constant dull ache that may have been worse after eating.  She was also feeling weak, cold, clammy with chills yesterday.  She did not check for fever but she suspected she had a fever. Yesterday, she ate toast and coffee for breakfast, half a pizza and salad for lunch and chicken breast sandwich last night for dinner.  She vomited twice during the night.  She noted no hematemesis  or bile green emesis. She  was so weak her daughter was unable to get her to walk to the car so she came to the emergency room with via EMS.   In the ER, LFT marked elevated from post op course and she was given stat dose antibiotics.  Patient has received Tylenol and that resolved her abdominal pain. She is feeling good now. No further nausea or vomiting.  She is on her way to have MRI MRCP for concern over choledocholithiasis.  Patient reports that she does take co-Q10, about B vitamins, magnesium 200 mg daily.  No new supplements.  She takes Tylenol 500 mg 2 about once a day over the last 3 days for headache.  She takes aspirin 81 mg at bedtime.  She does not take additional aspirin products.  She has no alcohol past or present history.  No NSAIDs.  History negative for liver disease.    Past Medical History:  Past Medical History:  Diagnosis Date   Arthritis    Asthma    Ashtma without status asthmaticus   COPD (chronic obstructive pulmonary disease) (HCC)    Dyspnea    Esophageal stricture    Fatty liver    GERD (gastroesophageal reflux disease)    Hypertension    Lipids serum increased    Obesity    Osteoporosis    Scoliosis    Trigger finger, acquired    Vertigo    no episodes in over 1 yr   Vitamin D deficiency     Problem List: Patient Active Problem List   Diagnosis Date Noted   Hypertension 04/12/2023   Acute cholecystitis 04/11/2023  COPD with acute exacerbation (HCC) 04/11/2023   Chest pain 04/11/2023   Benign essential hypertension 04/04/2018   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    Past Surgical History: Past Surgical History:  Procedure Laterality Date   ADENTAMOUS POLYP     APPENDECTOMY     BREAST CYST ASPIRATION Left    CATARACT EXTRACTION W/PHACO Right 11/01/2017   Procedure: CATARACT EXTRACTION PHACO AND INTRAOCULAR LENS PLACEMENT (IOC) RIGHT;  Surgeon: Lockie Mola, MD;  Location: [facility];  Service: Ophthalmology;  Laterality: Right;   CATARACT  EXTRACTION W/PHACO Left 01/10/2018   Procedure: CATARACT EXTRACTION PHACO AND INTRAOCULAR LENS PLACEMENT (IOC)  LEFT;  Surgeon: Lockie Mola, MD;  Location: [facility];  Service: Ophthalmology;  Laterality: Left;   COLON SURGERY     COLONOSCOPY     COLONOSCOPY WITH PROPOFOL N/A 07/31/2015   Procedure: COLONOSCOPY WITH PROPOFOL;  Surgeon: Scot Jun, MD;  Location: [facility] END[facility]COPY;  Service: Endoscopy;  Laterality: N/A;   COLONOSCOPY WITH PROPOFOL N/A 12/11/2015   Procedure: COLONOSCOPY WITH PROPOFOL;  Surgeon: Scot Jun, MD;  Location: [facility] ENDOSCOPY;  Service: Endoscopy;  Laterality: N/A;   COLONOSCOPY WITH PROPOFOL N/A 12/16/2020   Procedure: COLONOSCOPY WITH PROPOFOL;  Surgeon: Toledo, Boykin Nearing, MD;  Location: [facility] ENDOSCOPY;  Service: Gastroenterology;  Laterality: N/A;   ESOPHAGEAL DILITATION     HERNIA REPAIR     umbilical   rt ELBOW SURGERY     TUBAL LIGATION     UMBILICAL HERNIA REPAIR      Allergies: Allergies  Allergen Reactions   Alendronate Other (See Comments)    Joint pain   Statins Other (See Comments)    Muscle pain   Amoxicillin Rash    Home Medications: (Not in a hospital admission)  Home medication reconciliation was completed with the patient.    Family History: family history includes Breast cancer in her cousin; Breast cancer (age of onset: 41) in her maternal aunt.  The patient's family history is negative for inflammatory bowel disorders, GI malignancy, or solid organ transplantation.  Social History:   reports that she has never smoked. She has never used smokeless tobacco. She reports that she does not drink alcohol and does not use drugs. The patient denies ETOH, tobacco, or drug use.   Review of Systems: Constitutional: Weight is stable.  Eyes: No changes in vision. ENT: No oral lesions, sore throat.  GI: see HPI.  Heme/Lymph: No easy bruising.  CV: No chest pain.  GU: No hematuria.  Integumentary: No rashes.   Neuro: No headaches.  Psych: No depression/anxiety.  Endocrine: No heat/cold intolerance.  Allergic/Immunologic: No urticaria.  Resp: No cough, SOB.  Musculoskeletal: No joint swelling.    Physical Examination: BP (!) 130/54   Pulse 84   Temp 99 F (37.2 C) (Oral)   Resp (!) 26   Ht 5\' 1"  (1.549 m)   Wt 77.1 kg   SpO2 91%   BMI 32.12 kg/m  Gen: NAD, alert and oriented x 4. Dtr Janet Rios at bedside HEENT: EOMI Neck: supple, no JVD or thyromegaly Chest: CTA bilaterally, no wheezes, crackles, or other adventitious sounds CV: RRR, no m/g/c/r Abd: soft, NT, ND, +BS in all four quadrants; no HSM, guarding, rigidity, or rebound tenderness Ext: no edema Skin: no rash or lesions noted Lymph: no LAD  Data: Lab Results  Component Value Date   WBC 10.0 06/22/2023   HGB 14.9 06/22/2023   HCT 44.0 06/22/2023   MCV 90.5 06/22/2023   PLT  262 06/22/2023   Recent Labs  Lab 06/22/23 0827  HGB 14.9   Lab Results  Component Value Date   NA 135 06/22/2023   K 3.6 06/22/2023   CL 98 06/22/2023   CO2 24 06/22/2023   BUN 20 06/22/2023   CREATININE 0.98 06/22/2023   Lab Results  Component Value Date   ALT 225 (H) 06/22/2023   AST 511 (H) 06/22/2023   ALKPHOS 264 (H) 06/22/2023   BILITOT 2.4 (H) 06/22/2023   No results for input(s): "APTT", "INR", "PTT" in the last 168 hours. Assessment/Plan: Ms. Janet Rios is a 82 y.o. female has a medical history of HTN, COPD, acute cholecystitis.  Patient presented to the [facility] ED for chief complaint of upper abdominal pain, bloating, N/V, weakness, chills x2 days.   Upon presentation to the ED, vital signs were 179/85, 109, 18, 99.2, SpO2 93% Labs were significant for Gluc 146, Na 135, K 3.6, BUN 20, Cr 0.98, ALKP 264, albumin 3.8, AST 511, ALT 225, TB 2.4, eGFR 58, troponin 44-63, Lactic acid 2.8, WBC 10.0, Hgb 14.9, PLT 262.Bld cx x 2 pending.  Imaging studies:   CT A/P :  Mildly dilated common bile duct with hyperdense intraluminal material,  which could represent small stones, concerning for choledocholithiasis. No intrahepatic biliary ductal dilatation.Further evaluation with ERCP and/or MRCP is recommended. 2. Status post cholecystectomy. 3. Trace free fluid along the right paracolic gutter may be reactive. CXR no acute findings.  She seen for evaluation of acute abdominal pain, transaminitis s/p cholecystectomy (04/12/2023) concern for choledocholithiasis.    Recommendations: Await MRI MRCP results will help decision making and further recommendations for ERCP to be determined.  Additional labs ordered: for viral  hepatitis, iron studies, lipase, pro time, salicylate level, acetaminophen level. LFT trend was normal prior to acute cholecystitis and surgery and was trending down before this acute event 2 days ago.  Continue antibiotics started in he ED Blood cultures- results pending  Supportive  care primary team Pain control per primary team.  Trend LFT daily    Thank you for the consult. Please call with questions or concerns.  Amedeo Kinsman, ANP [facility] [phone]

## 2023-06-22 NOTE — ED Notes (Signed)
Pt is in MRI  

## 2023-06-22 NOTE — Progress Notes (Signed)
Pharmacy Antibiotic Note  Janet Rios is a 82 y.o. female admitted on 06/22/2023 with intra-abdominal infection.  Pharmacy has been consulted for Cefepime dosing.  Plan: Cefepime 2 gm IV X 1 given in ED on 11/21 @ 1649. Cefepime 2 gm IV Q12H ordered to start on 11/22 @ 0500.   Height: 5\' 1"  (154.9 cm) Weight: 77.1 kg (170 lb) IBW/kg (Calculated) : 47.8  Temp (24hrs), Avg:99.1 F (37.3 C), Min:99 F (37.2 C), Max:99.2 F (37.3 C)  Recent Labs  Lab 06/22/23 0827 06/22/23 1022 06/22/23 1216  WBC 10.0  --   --   CREATININE 0.98  --   --   LATICACIDVEN  --  2.9* 2.8*    Estimated Creatinine Clearance: 42.3 mL/min (by C-G formula based on SCr of 0.98 mg/dL).    Allergies  Allergen Reactions   Alendronate Other (See Comments)    Joint pain   Statins Other (See Comments)    Muscle pain   Amoxicillin Rash    Antimicrobials this admission:   >>    >>   Dose adjustments this admission:   Microbiology results:  BCx:   UCx:    Sputum:    MRSA PCR:   Thank you for allowing pharmacy to be a part of this patient's care.  Zaden Sako D 06/22/2023 10:09 PM

## 2023-06-22 NOTE — Assessment & Plan Note (Addendum)
S/p laparoscopic cholecystectomy 04/12/2023 Fatty liver N.p.o. from midnight for ERCP in the a.m. per gastroenterologist Dr. Leonarda Salon- procedure should be available Pain control, antiemetics, hydration Continue cefepime and Flagyl

## 2023-06-22 NOTE — ED Triage Notes (Signed)
Patient to ED via [facility] from home for generalized weakness- started a few days ago but worsening yesterday. Did have some episodes of vomiting yesterday. States she has had some SOB- hx of COPD and asthma. Aox4 Also having pain (tightness) under both breast.

## 2023-06-22 NOTE — ED Provider Notes (Signed)
[facility] Provider Note    Event Date/Time   First MD Initiated Contact with Patient 06/22/23 0945     (approximate)   History   Weakness   HPI  Janet Rios is a 82 y.o. female with a history of COPD chest pain and hypertension.  Also previous cholecystitis   Reviewed prior discharge summary from September patient had laparoscopic cholecystectomy.  For last couple days patient has felt like she has had low-grade fevers, she had some nausea and vomited nonbloody emesis twice yesterday.  She relates that she has been also having this pain sort of just below her rib cage radiates towards her back a little more on the right side for a couple of days.  She relates that at times it is uncomfortable.  She is felt a little short of breath but not wheezing with it.  No chest pain she really reports the pain is like just below her ribs  No leg swelling.  Presently no nausea no vomiting or pain but reports she feels fatigued.  Her daughter picked her up today and noted that she seemed very fatigued lightheaded as though she was going to pass out when she tried to bring her to the hospital  Physical Exam   Triage Vital Signs: ED Triage Vitals  Encounter Vitals Group     BP 06/22/23 0822 (!) 179/85     Systolic BP Percentile --      Diastolic BP Percentile --      Pulse Rate 06/22/23 0822 (!) 109     Resp 06/22/23 0822 18     Temp 06/22/23 0826 99.2 F (37.3 C)     Temp Source 06/22/23 0826 Oral     SpO2 06/22/23 0822 93 %     Weight 06/22/23 0823 170 lb (77.1 kg)     Height 06/22/23 0823 5\' 1"  (1.549 m)     Head Circumference --      Peak Flow --      Pain Score 06/22/23 0822 6     Pain Loc --      Pain Education --      Exclude from Growth Chart --     Most recent vital signs: Vitals:   06/22/23 1500 06/22/23 1830  BP: (!) 130/54 (!) 151/64  Pulse: 84 81  Resp: (!) 26 (!) 25  Temp:    SpO2: 91% 93%     General: Awake, no distress.  Moist  mucous membranes very pleasant CV:  Good peripheral perfusion.  Normal tones very slight tachycardia Resp:  Normal effort.  Very minimal tachypnea respiratory rate approximately 20  Abd:  No distention.  Soft throughout the lower abdomen left upper quadrant she reports mild tenderness in the epigastrium and moderate tenderness to palpation in the right upper quadrant Other:  No extremity edema   ED Results / Procedures / Treatments   Labs (all labs ordered are listed, but only abnormal results are displayed) Labs Reviewed  URINALYSIS, ROUTINE W REFLEX MICROSCOPIC - Abnormal; Notable for the following components:      Result Value   Color, Urine YELLOW (*)    APPearance CLEAR (*)    All other components within normal limits  COMPREHENSIVE METABOLIC PANEL - Abnormal; Notable for the following components:   Glucose, Bld 146 (*)    AST 511 (*)    ALT 225 (*)    Alkaline Phosphatase 264 (*)    Total Bilirubin 2.4 (*)  GFR, Estimated 58 (*)    All other components within normal limits  LACTIC ACID, PLASMA - Abnormal; Notable for the following components:   Lactic Acid, Venous 2.9 (*)    All other components within normal limits  LACTIC ACID, PLASMA - Abnormal; Notable for the following components:   Lactic Acid, Venous 2.8 (*)    All other components within normal limits  URINALYSIS, W/ REFLEX TO CULTURE (INFECTION SUSPECTED) - Abnormal; Notable for the following components:   Color, Urine YELLOW (*)    APPearance CLEAR (*)    All other components within normal limits  SALICYLATE LEVEL - Abnormal; Notable for the following components:   Salicylate Lvl <7.0 (*)    All other components within normal limits  TROPONIN I (HIGH SENSITIVITY) - Abnormal; Notable for the following components:   Troponin I (High Sensitivity) 44 (*)    All other components within normal limits  TROPONIN I (HIGH SENSITIVITY) - Abnormal; Notable for the following components:   Troponin I (High Sensitivity)  63 (*)    All other components within normal limits  RESP PANEL BY RT-PCR (RSV, FLU A&B, COVID)  RVPGX2  CULTURE, BLOOD (ROUTINE X 2)  CULTURE, BLOOD (ROUTINE X 2)  CBC  MAGNESIUM  PROTIME-INR  ACETAMINOPHEN LEVEL  PROTIME-INR  APTT  HEPATITIS PANEL, ACUTE  FERRITIN  IRON AND TIBC  LIPASE, BLOOD  CBG MONITORING, ED     EKG  And termer by me at 830 heart rate 110 QRS 86 somewhat hard to delineate the exact QT interval due to artifact, but query if there is indeed prolongation.     RADIOLOGY  Chest x-ray interpreted by me as negative for acute finding  CT ABDOMEN PELVIS W CONTRAST  Result Date: 06/22/2023 CLINICAL DATA:  Biliary obstruction suspected. Generalized weakness. Vomiting. EXAM: CT ABDOMEN AND PELVIS WITH CONTRAST TECHNIQUE: Multidetector CT imaging of the abdomen and pelvis was performed using the standard protocol following bolus administration of intravenous contrast. RADIATION DOSE REDUCTION: This exam was performed according to the departmental dose-optimization program which includes automated exposure control, adjustment of the mA and/or kV according to patient size and/or use of iterative reconstruction technique. CONTRAST:  80mL OMNIPAQUE IOHEXOL 350 MG/ML SOLN COMPARISON:  None Available. FINDINGS: Lower chest: Please refer to the same-day CT chest for description of intrathoracic findings. Hepatobiliary: No focal liver abnormality is seen. Status post cholecystectomy. Prominence of the common bile duct measuring up to 9 mm (series 6, image 44). There is hyperdense intraluminal material within the common bile duct. No intrahepatic biliary dilatation. Pancreas: Atrophic pancreas. No pancreatic ductal dilation or surrounding inflammatory changes. Spleen: Normal in size without focal abnormality. Adrenals/Urinary Tract: The adrenal glands are unremarkable. Mild left renal cortical atrophy. No suspicious focal lesion. No renal calculi or hydronephrosis. Bladder is  unremarkable. Stomach/Bowel: Stomach is within normal limits. Appendix is surgically absent. No evidence of bowel wall thickening, distention, or inflammatory changes. Vascular/Lymphatic: The abdominal aorta is normal in caliber. Aortic atherosclerosis. No enlarged abdominal or pelvic lymph nodes. Reproductive: Uterus and bilateral adnexa are unremarkable. Other: Trace free fluid along the right paracolic gutter (series 3, image 51). No free air. Musculoskeletal: No acute osseous abnormality. No suspicious osseous lesion. Multilevel degenerative changes of the lumbar spine. IMPRESSION: 1. Mildly dilated common bile duct with hyperdense intraluminal material, which could represent small stones, concerning for choledocholithiasis. No intrahepatic biliary ductal dilatation. Further evaluation with ERCP and/or MRCP is recommended. 2. Status post cholecystectomy. 3. Trace free fluid along the right  paracolic gutter may be reactive. 4.  Aortic Atherosclerosis (ICD10-I70.0). Electronically Signed   By: Hart Robinsons M.D.   On: 06/22/2023 13:24   CT Angio Chest PE W and/or Wo Contrast  Result Date: 06/22/2023 CLINICAL DATA:  Pulmonary embolism (PE) suspected, high prob RUQ vs R lower rib pain. EXAM: CT ANGIOGRAPHY CHEST WITH CONTRAST TECHNIQUE: Multidetector CT imaging of the chest was performed using the standard protocol during bolus administration of intravenous contrast. Multiplanar CT image reconstructions and MIPs were obtained to evaluate the vascular anatomy. RADIATION DOSE REDUCTION: This exam was performed according to the departmental dose-optimization program which includes automated exposure control, adjustment of the mA and/or kV according to patient size and/or use of iterative reconstruction technique. CONTRAST:  80mL OMNIPAQUE IOHEXOL 350 MG/ML SOLN COMPARISON:  CT chest dated March 28, 2023. FINDINGS: Cardiovascular: Satisfactory opacification of the pulmonary arteries to the segmental level. No  evidence of pulmonary embolism. The heart is mildly enlarged. No pericardial effusion. Multivessel coronary artery calcifications. Thoracic aorta is normal in caliber. Tortuosity of the descending thoracic aorta. Aortic atherosclerosis. Mediastinum/Nodes: No enlarged mediastinal, hilar, or axillary lymph nodes. Thyroid gland, trachea, and esophagus demonstrate no significant findings. Lungs/Pleura: Mild bibasilar linear atelectasis/scarring. Similar patchy interstitial changes in the posteromedial right lower lobe favored to represent atelectasis and/or scarring. No focal consolidation. No suspicious pulmonary nodule. No pleural effusion or pneumothorax. Upper Abdomen: Please refer to the same-day CT abdomen/pelvis for description of intra-abdominal findings. Musculoskeletal: Pectus excavatum. 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 No acute osseous abnormality. No suspicious osseous lesion. Review of the MIP images confirms the above findings. IMPRESSION: 1. No evidence of acute pulmonary embolism. 2. No acute intrathoracic findings. 3. Pectus excavatum. 4.  Aortic Atherosclerosis (ICD10-I70.0). Electronically Signed   By: Hart Robinsons M.D.   On: 06/22/2023 12:59   DG Chest 2 View  Result Date: 06/22/2023 CLINICAL DATA:  Weakness. EXAM: CHEST - 2 VIEW COMPARISON:  04/11/2023. FINDINGS: Low lung volume. Redemonstration of linear area of atelectasis and/or scarring at the right lung base. Bilateral lung fields are otherwise clear. No acute consolidation or lung collapse. Bilateral costophrenic angles are clear. Stable moderately enlarged cardio-mediastinal silhouette which is likely accentuated by low lung volume and AP technique. No acute osseous abnormalities. The soft tissues are within normal limits. IMPRESSION: *No active cardiopulmonary disease. Electronically Signed   By: Jules Schick M.D.   On: 06/22/2023 09:54      PROCEDURES:  Critical Care  performed: No  Procedures   MEDICATIONS ORDERED IN ED: Medications  sodium chloride 0.9 % bolus 500 mL (0 mLs Intravenous Stopped 06/22/23 1230)  iohexol (OMNIPAQUE) 350 MG/ML injection 75 mL (80 mLs Intravenous Contrast Given 06/22/23 1133)  acetaminophen (TYLENOL) tablet 650 mg (650 mg Oral Given 06/22/23 1229)  LORazepam (ATIVAN) injection 0.5 mg (0.5 mg Intravenous Given 06/22/23 1556)  ceFEPIme (MAXIPIME) 2 g in sodium chloride 0.9 % 100 mL IVPB (0 g Intravenous Stopped 06/22/23 1719)  metroNIDAZOLE (FLAGYL) IVPB 500 mg (500 mg Intravenous New Bag/Given 06/22/23 1751)     IMPRESSION / MDM / ASSESSMENT AND PLAN / ED COURSE  I reviewed the triage vital signs and the nursing notes.                              Differential diagnosis includes, but is not limited to, possible choledocholithiasis, retained stone, hepatitis, underlying liver disease, etc.  The patient does not have  any active chest pain or dyspnea, but reports a discomfort in her right lower rib epigastric area that radiates towards her back.  Patient's presentation is most consistent with acute presentation with potential threat to life or bodily function.  I am concerned about the patient's approximate cholecystectomy and now elevating transaminases and slightly increased bilirubin from her recent baseline.  She does have a recent transaminitis but it appears more prominent today.  Bring into concern potential acute hepatobiliary process  Additionally given her recent surgery, will obtain imaging including CT of the abdomen also include PE study.  Her troponin is mildly elevated I suspect this is demand she has no evidence of obvious chest pain.  Will repeat EKG as her first showed artifact though questionably prolonged QT could be present as well.  Her electrolytes appear appropriate.  Magnesium normal  Normal white blood cell count.  Currently shows evidence of systemic inflammatory response syndrome may be sepsis  though no source yet identified   The patient is on the cardiac monitor to evaluate for evidence of arrhythmia and/or significant heart rate changes.   CT imaging and the patient's clinical history are concerning for choledocholithiasis.  Additionally, I am a bit concerned she could be having early symptoms of cholangitis.  She has contraindication to penicillin, contraindication to QT prolonging medication.  Discussed with our pharmacist will, and recommendation is to start on cefepime and Flagyl which have been ordered.  Discussed also with her GI physician Dr. Timothy Lasso.  He recommends obtaining MRCP and will see the patient in consult.  He advises Dr. Servando Snare may be available (checking).  Ongoing care assigned to Dr. Vicente Males with plan to follow-up on MRCP/GI recommendations.  Patient will require admission.  Suspect elevated troponin due to demand ischemia or demand mismatch.  No active chest pain.  Follow-up with Dr. Ferd Hibbs recommendations and if ERCP available if needed     FINAL CLINICAL IMPRESSION(S) / ED DIAGNOSES   Final diagnoses:  Transaminitis  Elevated bilirubin  Upper abdominal pain     Rx / DC Orders   ED Discharge Orders     None        Note:  This document was prepared using Dragon voice recognition software and may include unintentional dictation errors.   Sharyn Creamer, MD 06/22/23 1910

## 2023-06-22 NOTE — Assessment & Plan Note (Signed)
BP elevated, likely related to pain Pain control Hydralazine IV as needed while n.p.o.

## 2023-06-22 NOTE — ED Provider Notes (Signed)
Emergency department handoff note  Care of this patient was signed out to me at the end of the previous provider shift.  All pertinent patient information was conveyed and all questions were answered.  Patient pending MRCP showing choledocholithiasis.  After reengaging Dr. Timothy Lasso, he has stated that although he cannot see the images at home, Dr. Servando Snare should be able to perform an ERCP tomorrow.  The patient has already received empiric antibiotics and agrees with plan for admission to the internal medicine service.  Dispo: Admit to medicine with GI   Merwyn Katos, MD 06/22/23 2149

## 2023-06-22 NOTE — Assessment & Plan Note (Signed)
Not acutely exacerbated ?Continue home inhalers with DuoNebs as needed ?

## 2023-06-23 ENCOUNTER — Encounter
Admission: EM | Disposition: A | Discharge status: Home / Self Care | Payer: Self-pay | Source: Home / Self Care | Attending: Hospitalist

## 2023-06-23 ENCOUNTER — Inpatient Hospital Stay: Payer: Medicare Other | Admitting: Certified Registered"

## 2023-06-23 ENCOUNTER — Inpatient Hospital Stay: Payer: Medicare Other

## 2023-06-23 ENCOUNTER — Encounter: Payer: Self-pay | Admitting: Internal Medicine

## 2023-06-23 DIAGNOSIS — K8050 Calculus of bile duct without cholangitis or cholecystitis without obstruction: Secondary | ICD-10-CM | POA: Diagnosis not present

## 2023-06-23 HISTORY — PX: REMOVAL OF STONES: SHX5545

## 2023-06-23 HISTORY — PX: ERCP: SHX5425

## 2023-06-23 LAB — PROTIME-INR
INR: 1.2 (ref 0.8–1.2)
Prothrombin Time: 15.2 s (ref 11.4–15.2)

## 2023-06-23 LAB — APTT: aPTT: 36 s (ref 24–36)

## 2023-06-23 SURGERY — ERCP, WITH INTERVENTION IF INDICATED
Anesthesia: General

## 2023-06-23 MED ORDER — ENOXAPARIN SODIUM 40 MG/0.4ML IJ SOSY
40.0000 mg | PREFILLED_SYRINGE | INTRAMUSCULAR | Status: DC
  Administered 2023-06-24 – 2023-06-25 (×2): 40 mg via SUBCUTANEOUS
  Filled 2023-06-23 (×2): qty 0.4

## 2023-06-23 MED ORDER — ONDANSETRON HCL 4 MG/2ML IJ SOLN
INTRAMUSCULAR | Status: DC | PRN
  Administered 2023-06-23: 4 mg via INTRAVENOUS

## 2023-06-23 MED ORDER — METOPROLOL SUCCINATE ER 50 MG PO TB24
100.0000 mg | ORAL_TABLET | Freq: Every morning | ORAL | Status: DC
  Administered 2023-06-24 – 2023-06-26 (×3): 100 mg via ORAL
  Filled 2023-06-23 (×3): qty 2

## 2023-06-23 MED ORDER — ROCURONIUM BROMIDE 100 MG/10ML IV SOLN
INTRAVENOUS | Status: DC | PRN
  Administered 2023-06-23: 20 mg via INTRAVENOUS
  Administered 2023-06-23: 10 mg via INTRAVENOUS

## 2023-06-23 MED ORDER — GLYCOPYRROLATE 0.2 MG/ML IJ SOLN
INTRAMUSCULAR | Status: DC | PRN
  Administered 2023-06-23: .2 mg via INTRAVENOUS

## 2023-06-23 MED ORDER — PANTOPRAZOLE SODIUM 40 MG PO TBEC
40.0000 mg | DELAYED_RELEASE_TABLET | Freq: Every day | ORAL | Status: DC
  Administered 2023-06-24 – 2023-06-26 (×3): 40 mg via ORAL
  Filled 2023-06-23 (×3): qty 1

## 2023-06-23 MED ORDER — ESMOLOL HCL 100 MG/10ML IV SOLN
INTRAVENOUS | Status: DC | PRN
  Administered 2023-06-23: 20 mg via INTRAVENOUS

## 2023-06-23 MED ORDER — LACTATED RINGERS IV SOLN: INTRAVENOUS | Status: DC | PRN

## 2023-06-23 MED ORDER — MONTELUKAST SODIUM 10 MG PO TABS
10.0000 mg | ORAL_TABLET | Freq: Every day | ORAL | Status: DC
  Administered 2023-06-24 – 2023-06-26 (×3): 10 mg via ORAL
  Filled 2023-06-23 (×3): qty 1

## 2023-06-23 MED ORDER — LIDOCAINE HCL (CARDIAC) PF 100 MG/5ML IV SOSY
PREFILLED_SYRINGE | INTRAVENOUS | Status: DC | PRN
  Administered 2023-06-23: 100 mg via INTRAVENOUS

## 2023-06-23 MED ORDER — DICLOFENAC SUPPOSITORY 100 MG
100.0000 mg | Freq: Once | RECTAL | Status: AC
  Administered 2023-06-23: 100 mg via RECTAL

## 2023-06-23 MED ORDER — METOPROLOL SUCCINATE ER 25 MG PO TB24
25.0000 mg | ORAL_TABLET | Freq: Every day | ORAL | Status: DC
  Administered 2023-06-23 – 2023-06-25 (×3): 25 mg via ORAL
  Filled 2023-06-23 (×3): qty 1

## 2023-06-23 MED ORDER — DICLOFENAC SUPPOSITORY 100 MG
RECTAL | Status: AC
  Filled 2023-06-23: qty 1

## 2023-06-23 MED ORDER — PROPOFOL 10 MG/ML IV BOLUS
INTRAVENOUS | Status: DC | PRN
  Administered 2023-06-23: 100 mg via INTRAVENOUS

## 2023-06-23 MED ORDER — SUGAMMADEX SODIUM 200 MG/2ML IV SOLN
INTRAVENOUS | Status: DC | PRN
  Administered 2023-06-23: 140 mg via INTRAVENOUS

## 2023-06-23 MED ORDER — SUCCINYLCHOLINE CHLORIDE 200 MG/10ML IV SOSY
PREFILLED_SYRINGE | INTRAVENOUS | Status: DC | PRN
  Administered 2023-06-23: 100 mg via INTRAVENOUS

## 2023-06-23 MED ORDER — LORATADINE 10 MG PO TABS
10.0000 mg | ORAL_TABLET | Freq: Every day | ORAL | Status: DC
  Administered 2023-06-23 – 2023-06-26 (×4): 10 mg via ORAL
  Filled 2023-06-23 (×4): qty 1

## 2023-06-23 NOTE — Progress Notes (Signed)
MEWS Progress Note  Patient Details Name: DONYE ELDREDGE MRN: [id number] DOB: 13-Jan-1941 Today's Date: 06/23/2023   MEWS Flowsheet Documentation:  Assess: MEWS Score Temp: 100 F (37.8 C) BP: (!) 148/61 MAP (mmHg): 77 Pulse Rate: (!) 109 ECG Heart Rate: 100 Resp: 16 SpO2: 92 % O2 Device: Room Air Assess: MEWS Score MEWS Temp: 0 MEWS Systolic: 0 MEWS Pulse: 1 MEWS RR: 0 MEWS LOC: 0 MEWS Score: 1 MEWS Score Color: Green Assess: SIRS CRITERIA SIRS Temperature : 0 SIRS Respirations : 0 SIRS Pulse: 1 SIRS WBC: 0 SIRS Score Sum : 1 Assess: if the MEWS score is Yellow or Red Were vital signs accurate and taken at a resting state?: Yes Does the patient meet 2 or more of the SIRS criteria?: No MEWS guidelines implemented : No, previously yellow, continue vital signs every 4 hours Treat MEWS Interventions: Considered administering scheduled or prn medications/treatments as ordered Take Vital Signs Increase Vital Sign Frequency : Yellow: Q2hr x1, continue Q4hrs until patient remains green for 12hrs Escalate MEWS: Escalate: Yellow: Discuss with charge nurse and consider notifying provider and/or RRT        Fredric Dine 06/23/2023, 1:39 AM

## 2023-06-23 NOTE — Transfer of Care (Signed)
Immediate Anesthesia Transfer of Care Note  Patient: Janet Rios  Procedure(s) Performed: ENDOSCOPIC RETROGRADE CHOLANGIOPANCREATOGRAPHY (ERCP) REMOVAL OF STONES  Patient Location: Endoscopy Unit  Anesthesia Type:General  Level of Consciousness: awake, drowsy, and patient cooperative  Airway & Oxygen Therapy: Patient Spontanous Breathing and Patient connected to face mask oxygen  Post-op Assessment: Report given to RN and Post -op Vital signs reviewed and stable  Post vital signs: Reviewed and stable  Last Vitals:  Vitals Value Taken Time  BP 165/86 06/23/23 1306  Temp 37.2 C 06/23/23 1303  Pulse 98 06/23/23 1307  Resp 24 06/23/23 1309  SpO2 99 % 06/23/23 1307  Vitals shown include unfiled device data.  Last Pain:  Vitals:   06/23/23 1303  TempSrc: Temporal  PainSc: Asleep      Patients Stated Pain Goal: 0 (06/22/23 2344)  Complications: No notable events documented.

## 2023-06-23 NOTE — Anesthesia Postprocedure Evaluation (Signed)
Anesthesia Post Note  Patient: Janet Rios  Procedure(s) Performed: ENDOSCOPIC RETROGRADE CHOLANGIOPANCREATOGRAPHY (ERCP) REMOVAL OF STONES  Patient location during evaluation: PACU Anesthesia Type: General Level of consciousness: awake Pain management: pain level controlled Respiratory status: spontaneous breathing Cardiovascular status: stable Anesthetic complications: no   No notable events documented.   Last Vitals:  Vitals:   06/23/23 1323 06/23/23 1346  BP: (!) 144/72 (!) 141/56  Pulse:  (!) 107  Resp:  16  Temp:  36.9 C  SpO2:  94%    Last Pain:  Vitals:   06/23/23 1346  TempSrc: Oral  PainSc:                  VAN STAVEREN,Nichol Ator

## 2023-06-23 NOTE — Anesthesia Preprocedure Evaluation (Signed)
Anesthesia Evaluation  Patient identified by MRN, date of birth, ID band Patient awake    Reviewed: Allergy & Precautions, NPO status , Patient's Chart, lab work & pertinent test results  Airway Mallampati: III  TM Distance: <3 FB Neck ROM: Full    Dental  (+) Teeth Intact   Pulmonary neg pulmonary ROS, COPD,  COPD inhaler   Pulmonary exam normal  + decreased breath sounds      Cardiovascular Exercise Tolerance: Good hypertension, Pt. on medications negative cardio ROS Normal cardiovascular exam Rhythm:Regular Rate:Normal     Neuro/Psych negative neurological ROS  negative psych ROS   GI/Hepatic negative GI ROS, Neg liver ROS,GERD  Medicated,,  Endo/Other  negative endocrine ROS    Renal/GU negative Renal ROS  negative genitourinary   Musculoskeletal   Abdominal  (+) + obese  Peds negative pediatric ROS (+)  Hematology negative hematology ROS (+)   Anesthesia Other Findings Past Medical History: No date: Arthritis No date: Asthma     Comment:  Ashtma without status asthmaticus No date: COPD (chronic obstructive pulmonary disease) (HCC) No date: Dyspnea No date: Esophageal stricture No date: Fatty liver No date: GERD (gastroesophageal reflux disease) No date: Hypertension No date: Lipids serum increased No date: Obesity No date: Osteoporosis No date: Scoliosis No date: Trigger finger, acquired No date: Vertigo     Comment:  no episodes in over 1 yr No date: Vitamin D deficiency  Past Surgical History: No date: ADENTAMOUS POLYP No date: APPENDECTOMY No date: BREAST CYST ASPIRATION; Left 11/01/2017: CATARACT EXTRACTION W/PHACO; Right     Comment:  Procedure: CATARACT EXTRACTION PHACO AND INTRAOCULAR               LENS PLACEMENT (IOC) RIGHT;  Surgeon: Lockie Mola, MD;  Location: [facility];  Service:               Ophthalmology;  Laterality: Right; 01/10/2018: CATARACT  EXTRACTION W/PHACO; Left     Comment:  Procedure: CATARACT EXTRACTION PHACO AND INTRAOCULAR               LENS PLACEMENT (IOC)  LEFT;  Surgeon: Lockie Mola, MD;  Location: [facility];  Service:               Ophthalmology;  Laterality: Left; No date: COLON SURGERY No date: COLONOSCOPY 07/31/2015: COLONOSCOPY WITH PROPOFOL; N/A     Comment:  Procedure: COLONOSCOPY WITH PROPOFOL;  Surgeon: Scot Jun, MD;  Location: [facility];  Service:               Endoscopy;  Laterality: N/A; 12/11/2015: COLONOSCOPY WITH PROPOFOL; N/A     Comment:  Procedure: COLONOSCOPY WITH PROPOFOL;  Surgeon: Scot Jun, MD;  Location: [facility] END[facility]COPY;  Service:               Endoscopy;  Laterality: N/A; 12/16/2020: COLONOSCOPY WITH PROPOFOL; N/A     Comment:  Procedure: COLONOSCOPY WITH PROPOFOL;  Surgeon: [place],               Boykin Nearing, MD;  Location: [facility] ENDOSCOPY;  Service:  Gastroenterology;  Laterality: N/A; No date: ESOPHAGEAL DILITATION No date: HERNIA REPAIR     Comment:  umbilical No date: rt ELBOW SURGERY No date: TUBAL LIGATION No date: UMBILICAL HERNIA REPAIR  BMI    Body Mass Index: 32.16 kg/m      Reproductive/Obstetrics negative OB ROS                             Anesthesia Physical Anesthesia Plan  ASA: 3  Anesthesia Plan: General   Post-op Pain Management:    Induction: Intravenous  PONV Risk Score and Plan: Propofol infusion and TIVA  Airway Management Planned: Natural Airway and Nasal Cannula  Additional Equipment:   Intra-op Plan:   Post-operative Plan:   Informed Consent: I have reviewed the patients History and Physical, chart, labs and discussed the procedure including the risks, benefits and alternatives for the proposed anesthesia with the patient or authorized representative who has indicated his/her understanding and acceptance.     Dental Advisory  Given  Plan Discussed with: CRNA and Surgeon  Anesthesia Plan Comments:        Anesthesia Quick Evaluation

## 2023-06-23 NOTE — Progress Notes (Signed)
  PROGRESS NOTE    Janet Rios  ZOX:[id number] DOB: [birth year] DOA: 06/22/2023 PCP: Danella Penton, MD  212A/212A-AA  LOS: 1 day   Brief hospital course:   Assessment & Plan: Janet Rios is a 82 y.o. female with medical history significant for HTN, COPD who is s/p cholecystectomy 04/12/2023, who presents to the ED With a 2-day history of low-grade fevers, nausea, and post prandial right upper quadrant pain radiating to her back.    * Choledocholithiasis S/p laparoscopic cholecystectomy 04/12/2023 --ERCP today Continue cefepime and Flagyl  Chronic obstructive pulmonary disease (COPD) (HCC) Not acutely exacerbated --cont home daily bronchodilators  Hypertension BP elevated, likely related to pain --resume home Toprol  Mild trop elevation 2/2 demand ischemia --trop 44, 63  Lactic acidosis --cont MIVF   DVT prophylaxis: Lovenox SQ Code Status: Full code  Family Communication: son-in-law updated at bedside today Level of care: Telemetry Medical Dispo:   The patient is from: home Anticipated d/c is to: home Anticipated d/c date is: 2 days   Subjective and Interval History:  Pt reported pain much improved after ERCP.   Objective: Vitals:   06/23/23 1313 06/23/23 1323 06/23/23 1346 06/23/23 1816  BP: (!) 151/67 (!) 144/72 (!) 141/56 (!) 142/64  Pulse: (!) 105  (!) 107 98  Resp: (!) 24  16 18   Temp:   98.4 F (36.9 C) 98.3 F (36.8 C)  TempSrc:   Oral Oral  SpO2: 97%  94% 93%  Weight:      Height:        Intake/Output Summary (Last 24 hours) at 06/23/2023 1851 Last data filed at 06/23/2023 1301 Gross per 24 hour  Intake 300 ml  Output --  Net 300 ml   Filed Weights   06/22/23 0823 06/22/23 2344  Weight: 77.1 kg 77.2 kg    Examination:   Constitutional: NAD, AAOx3 HEENT: conjunctivae and lids normal, EOMI CV: No cyanosis.   RESP: normal respiratory effort, on RA Neuro: II - XII grossly intact.   Psych: Normal mood and affect.  Appropriate  judgement and reason   Data Reviewed: I have personally reviewed labs and imaging studies  Time spent: 50 minutes  Darlin Priestly, MD [facility] If 7PM-7AM, please contact night-coverage 06/23/2023, 6:51 PM

## 2023-06-23 NOTE — Anesthesia Procedure Notes (Signed)
Procedure Name: Intubation Date/Time: 06/23/2023 12:29 PM  Performed by: Mohammed Kindle, CRNAPre-anesthesia Checklist: Patient identified, Emergency Drugs available, Suction available and Patient being monitored Patient Re-evaluated:Patient Re-evaluated prior to induction Oxygen Delivery Method: Circle system utilized Preoxygenation: Pre-oxygenation with 100% oxygen Induction Type: IV induction Ventilation: Mask ventilation without difficulty Laryngoscope Size: McGrath and 3 Grade View: Grade I Tube type: Oral Tube size: 6.5 mm Number of attempts: 1 Airway Equipment and Method: Stylet Placement Confirmation: ETT inserted through vocal cords under direct vision, positive ETCO2, breath sounds checked- equal and bilateral and CO2 detector Secured at: 21 cm Tube secured with: Tape Dental Injury: Teeth and Oropharynx as per pre-operative assessment

## 2023-06-23 NOTE — TOC CM/SW Note (Signed)
Transition of Care [facility]) - Inpatient Brief Assessment   Patient Details  Name: Janet Rios MRN: [id number] Date of Birth: January 11, 1941  Transition of Care [facility]) CM/SW Contact:    Chapman Fitch, RN Phone Number: 06/23/2023, 11:35 AM   Clinical Narrative:   Transition of Care [facility]) Screening Note   Patient Details  Name: Janet Rios Date of Birth: [birth year]   Transition of Care [facility]) CM/SW Contact:    Chapman Fitch, RN Phone Number: 06/23/2023, 11:35 AM    Transition of Care Department [facility]) has reviewed patient and no TOC needs have been identified at this time.  If new patient transition needs arise, please place a TOC consult.    Transition of Care Asessment: Insurance and Status: Insurance coverage has been reviewed Patient has primary care physician: Yes     Prior/Current Home Services: No current home services Social Determinants of Health Reivew: SDOH reviewed no interventions necessary Readmission risk has been reviewed: Yes Transition of care needs: no transition of care needs at this time

## 2023-06-23 NOTE — Progress Notes (Signed)
   06/22/23 2344  Assess: MEWS Score  Temp 99.2 F (37.3 C)  BP (!) 173/105  MAP (mmHg) 123  Pulse Rate (!) 124  Resp 18  SpO2 93 %  O2 Device Room Air  Assess: MEWS Score  MEWS Temp 0  MEWS Systolic 0  MEWS Pulse 2  MEWS RR 0  MEWS LOC 0  MEWS Score 2  MEWS Score Color Yellow  Assess: if the MEWS score is Yellow or Red  Were vital signs accurate and taken at a resting state? Yes  Does the patient meet 2 or more of the SIRS criteria? No  MEWS guidelines implemented  Yes, yellow  Treat  MEWS Interventions Considered administering scheduled or prn medications/treatments as ordered  Take Vital Signs  Increase Vital Sign Frequency  Yellow: Q2hr x1, continue Q4hrs until patient remains green for 12hrs  Escalate  MEWS: Escalate Yellow: Discuss with charge nurse and consider notifying provider and/or RRT  Notify: Charge Nurse/RN  Name of Charge Nurse/RN Notified Gerda Diss, RN  Provider Notification  Provider Name/Title Manuela Schwartz, NP  Date Provider Notified 06/23/23  Time Provider Notified [phone]  Method of Notification Page (Secure chat)  Notification Reason Change in status (Yellow MEWS)  Provider response No new orders  Date of Provider Response 06/23/23  Time of Provider Response 0048  Assess: SIRS CRITERIA  SIRS Temperature  0  SIRS Pulse 1  SIRS Respirations  0  SIRS WBC 0  SIRS Score Sum  1

## 2023-06-23 NOTE — Progress Notes (Signed)
Inpatient Follow-up/Progress Note   Patient ID: Janet Rios is a 82 y.o. female.  Overnight Events / Subjective Findings Daughter at bedside. Abdominal pain improved. No fever/chills. MRI MRCP positive for choledoco- pt pending ERCP today  Review of Systems  Constitutional:  Negative for activity change, appetite change, chills, diaphoresis, fatigue, fever and unexpected weight change.  HENT:  Negative for trouble swallowing and voice change.   Respiratory:  Negative for shortness of breath and wheezing.   Cardiovascular:  Negative for chest pain, palpitations and leg swelling.  Gastrointestinal:  Positive for abdominal pain. Negative for abdominal distention, anal bleeding, blood in stool, constipation, diarrhea, nausea, rectal pain and vomiting.  Musculoskeletal:  Negative for arthralgias and myalgias.  Skin:  Negative for color change and pallor.  Neurological:  Negative for dizziness, syncope and weakness.  Psychiatric/Behavioral:  Negative for confusion.   All other systems reviewed and are negative.    Medications  Current Facility-Administered Medications:    [MAR Hold] acetaminophen (TYLENOL) tablet 650 mg, 650 mg, Oral, Q6H PRN, 650 mg at 06/22/23 2359 **OR** [MAR Hold] acetaminophen (TYLENOL) suppository 650 mg, 650 mg, Rectal, Q6H PRN, Andris Baumann, MD   [MAR Hold] albuterol (PROVENTIL) (2.5 MG/3ML) 0.083% nebulizer solution 2.5 mg, 2.5 mg, Inhalation, Q4H PRN, Andris Baumann, MD   [MAR Hold] ALPRAZolam Prudy Feeler) tablet 0.25 mg, 0.25 mg, Oral, QHS PRN, Andris Baumann, MD   [MAR Hold] ceFEPIme (MAXIPIME) 2 g in sodium chloride 0.9 % 100 mL IVPB, 2 g, Intravenous, Q12H, Lindajo Royal V, MD, Last Rate: 200 mL/hr at 06/23/23 0529, 2 g at 06/23/23 4540   diclofenac suppository 100 mg, 100 mg, Rectal, Once, Midge Minium, MD   Mitzi Hansen Hold] fluticasone furoate-vilanterol (BREO ELLIPTA) 100-25 MCG/ACT 1 puff, 1 puff, Inhalation, Daily, 1 puff at 06/23/23 0820 **AND** [MAR Hold]  umeclidinium bromide (INCRUSE ELLIPTA) 62.5 MCG/ACT 1 puff, 1 puff, Inhalation, Daily, Lindajo Royal V, MD, 1 puff at 06/23/23 0820   [MAR Hold] hydrALAZINE (APRESOLINE) injection 5 mg, 5 mg, Intravenous, Q4H PRN, Lindajo Royal V, MD, 5 mg at 06/22/23 2359   [MAR Hold] ketorolac (TORADOL) 30 MG/ML injection 30 mg, 30 mg, Intravenous, Q6H PRN, Andris Baumann, MD   lactated ringers infusion, , Intravenous, Continuous, Bradler, Clent Jacks, MD   [MAR Hold] metroNIDAZOLE (FLAGYL) IVPB 500 mg, 500 mg, Intravenous, Q12H, Lindajo Royal V, MD, Last Rate: 100 mL/hr at 06/23/23 0612, 500 mg at 06/23/23 0612   [MAR Hold] morphine (PF) 2 MG/ML injection 2 mg, 2 mg, Intravenous, Q2H PRN, Andris Baumann, MD   [facility] Hold] prochlorperazine (COMPAZINE) injection 10 mg, 10 mg, Intravenous, Q4H PRN, Andris Baumann, MD  [MAR Hold] ceFEPime (MAXIPIME) IV 2 g (06/23/23 0529)   lactated ringers     [MAR Hold] metronidazole 500 mg (06/23/23 0612)    [MAR Hold] acetaminophen **OR** [MAR Hold] acetaminophen, [MAR Hold] albuterol, [MAR Hold] ALPRAZolam, [MAR Hold] hydrALAZINE, [MAR Hold] ketorolac, [MAR Hold]  morphine injection, [MAR Hold] prochlorperazine   Objective    Vitals:   06/23/23 0047 06/23/23 0443 06/23/23 0822 06/23/23 1138  BP: (!) 148/61 138/60 (!) 148/69 (!) 176/79  Pulse: (!) 109 100 96 (!) 105  Resp: 16 18 18 20   Temp: 100 F (37.8 C) 98.6 F (37 C) 98.8 F (37.1 C) 99 F (37.2 C)  TempSrc: Oral Oral Oral Temporal  SpO2: 92% 94% 93% 94%  Weight:      Height:  Physical Exam Vitals and nursing note reviewed.  Constitutional:      General: She is not in acute distress.    Appearance: Normal appearance. She is not ill-appearing, toxic-appearing or diaphoretic.  HENT:     Head: Normocephalic and atraumatic.     Nose: Nose normal.     Mouth/Throat:     Mouth: Mucous membranes are moist.     Pharynx: Oropharynx is clear.  Eyes:     General: No scleral icterus.    Extraocular  Movements: Extraocular movements intact.  Cardiovascular:     Rate and Rhythm: Normal rate and regular rhythm.     Heart sounds: Normal heart sounds. No murmur heard.    No friction rub. No gallop.  Pulmonary:     Effort: Pulmonary effort is normal. No respiratory distress.     Breath sounds: Normal breath sounds. No wheezing, rhonchi or rales.  Abdominal:     General: Bowel sounds are normal. There is no distension.     Palpations: Abdomen is soft.     Tenderness: There is no abdominal tenderness. There is no guarding or rebound.  Musculoskeletal:     Cervical back: Neck supple.     Right lower leg: No edema.     Left lower leg: No edema.  Skin:    General: Skin is warm and dry.     Coloration: Skin is not jaundiced or pale.  Neurological:     General: No focal deficit present.     Mental Status: She is alert and oriented to person, place, and time. Mental status is at baseline.  Psychiatric:        Mood and Affect: Mood normal.        Behavior: Behavior normal.        Thought Content: Thought content normal.        Judgment: Judgment normal.      Laboratory Data Recent Labs  Lab 06/22/23 0827  WBC 10.0  HGB 14.9  HCT 44.0  PLT 262   Recent Labs  Lab 06/22/23 0827  NA 135  K 3.6  CL 98  CO2 24  BUN 20  CREATININE 0.98  CALCIUM 9.8  PROT 7.7  BILITOT 2.4*  ALKPHOS 264*  ALT 225*  AST 511*  GLUCOSE 146*   Recent Labs  Lab 06/22/23 1611  INR 1.1      Imaging Studies: MR 3D Recon At Scanner  Result Date: 06/22/2023 CLINICAL DATA:  Nonspecific (abnormal) findings on radiological and other examination of musculoskeletal system biliary tree EXAM: 3-DIMENSIONAL MR IMAGE RENDERING ON ACQUISITION WORKSTATION TECHNIQUE: 3-dimensional MR images were rendered by post-processing of the original MR data on an acquisition workstation. The 3-dimensional MR images were interpreted and findings were reported in the accompanying complete MR report for this study  COMPARISON:  CT abdomen pelvis 06/22/2023 earlier. Ultrasound 04/11/2023. Chest CT 03/28/2023. FINDINGS: Lower chest: Pectus excavatum seen. The heart enlarged. No significant pleural effusion the lung bases. Bandlike linear changes at the bases as well. Scar or atelectasis. Please correlate with prior CT. Hepatobiliary: The liver has patchy areas of signal drop on out of phase imaging consistent with components of fatty infiltration. Slightly nodular contours of the liver as well. No areas of restricted diffusion in the liver. No abnormal T2 signal. Gallbladder is absent. The common duct measures up to 12 mm in diameter. Slight left-sided intrahepatic biliary ductal dilatation. There are several filling defects along the course of the common duct consistent with choledocholithiasis. At  least 5 lesions are seen. There is some mild edema in the porta hepatis. Largest measures up to 12 mm. The duct does taper towards the pancreatic head. Pancreas: Moderate atrophy of the pancreas. No significant pancreatic ductal dilatation. No restricted diffusion. Spleen: Within normal limits in size and appearance. Small anterior splenule. Adrenals/Urinary Tract: Adrenal glands are preserved. Mild bilateral renal atrophy. Nonspecific perinephric stranding. No obvious mass on this noncontrast examination. Stomach/Bowel: Visualized portions of the bowel are nondilated. Third portion duodenal diverticula. Additional small bowel diverticula more distally in the midabdomen. Vascular/Lymphatic: Circumaortic left renal vein. Normal caliber aorta and IVC. No specific abnormal lymph node enlargement identified in the visualized abdomen. Other: No significant ascites. The cystic area in the right pericolic gutter is again identified and appears simple by noncontrast MRI. Cephalocaudal length of 4.3 cm. Musculoskeletal: Curvature and degenerative changes along the spine. Motion seen throughout the examination. IMPRESSION: Choledocholithiasis  with numerous stones along the course of the common duct measuring up to 12 mm. The common duct is ectatic at 12 mm but does taper towards the pancreatic head. Only slight intrahepatic biliary ductal dilatation particularly along the left side. Prior cholecystectomy. Simple appearing areas of fluid or cystic lesion in the right pericolic gutter as seen on CT. Please correlate with the history. Fatty liver infiltration with slightly nodular contour. Evaluation limited by motion. Electronically Signed   By: Karen Kays M.D.   On: 06/22/2023 21:22   MR ABDOMEN MRCP WO CONTRAST  Result Date: 06/22/2023 CLINICAL DATA:  Worsening abdominal pain.  Weakness. EXAM: MRI ABDOMEN WITHOUT CONTRAST  (INCLUDING MRCP) TECHNIQUE: Multiplanar multisequence MR imaging of the abdomen was performed. Heavily T2-weighted images of the biliary and pancreatic ducts were obtained, and three-dimensional MRCP images were rendered by post processing. COMPARISON:  CT abdomen pelvis 06/22/2023 earlier. Ultrasound 04/11/2023. Chest CT 03/28/2023. FINDINGS: Lower chest: Pectus excavatum seen. The heart enlarged. No significant pleural effusion the lung bases. Bandlike linear changes at the bases as well. Scar or atelectasis. Please correlate with prior CT. Hepatobiliary: The liver has patchy areas of signal drop on out of phase imaging consistent with components of fatty infiltration. Slightly nodular contours of the liver as well. No areas of restricted diffusion in the liver. No abnormal T2 signal. Gallbladder is absent. The common duct measures up to 12 mm in diameter. Slight left-sided intrahepatic biliary ductal dilatation. There are several filling defects along the course of the common duct consistent with choledocholithiasis. At least 5 lesions are seen. There is some mild edema in the porta hepatis. Largest measures up to 12 mm. The duct does taper towards the pancreatic head. Pancreas: Moderate atrophy of the pancreas. No  significant pancreatic ductal dilatation. No restricted diffusion. Spleen: Within normal limits in size and appearance. Small anterior splenule. Adrenals/Urinary Tract: Adrenal glands are preserved. Mild bilateral renal atrophy. Nonspecific perinephric stranding. No obvious mass on this noncontrast examination. Stomach/Bowel: Visualized portions of the bowel are nondilated. Third portion duodenal diverticula. Additional small bowel diverticula more distally in the midabdomen. Vascular/Lymphatic: Circumaortic left renal vein. Normal caliber aorta and IVC. No specific abnormal lymph node enlargement identified in the visualized abdomen. Other: No significant ascites. The cystic area in the right pericolic gutter is again identified and appears simple by noncontrast MRI. Cephalocaudal length of 4.3 cm. Musculoskeletal: Curvature and degenerative changes along the spine. Motion seen throughout the examination. IMPRESSION: Choledocholithiasis with numerous stones along the course of the common duct measuring up to 12 mm. The common duct is ectatic  at 12 mm but does taper towards the pancreatic head. Only slight intrahepatic biliary ductal dilatation particularly along the left side. Prior cholecystectomy. Simple appearing areas of fluid or cystic lesion in the right pericolic gutter as seen on CT. Please correlate with the history. Fatty liver infiltration with slightly nodular contour. Evaluation limited by motion. Electronically Signed   By: Karen Kays M.D.   On: 06/22/2023 21:18   CT ABDOMEN PELVIS W CONTRAST  Result Date: 06/22/2023 CLINICAL DATA:  Biliary obstruction suspected. Generalized weakness. Vomiting. EXAM: CT ABDOMEN AND PELVIS WITH CONTRAST TECHNIQUE: Multidetector CT imaging of the abdomen and pelvis was performed using the standard protocol following bolus administration of intravenous contrast. RADIATION DOSE REDUCTION: This exam was performed according to the departmental dose-optimization  program which includes automated exposure control, adjustment of the mA and/or kV according to patient size and/or use of iterative reconstruction technique. CONTRAST:  80mL OMNIPAQUE IOHEXOL 350 MG/ML SOLN COMPARISON:  None Available. FINDINGS: Lower chest: Please refer to the same-day CT chest for description of intrathoracic findings. Hepatobiliary: No focal liver abnormality is seen. Status post cholecystectomy. Prominence of the common bile duct measuring up to 9 mm (series 6, image 44). There is hyperdense intraluminal material within the common bile duct. No intrahepatic biliary dilatation. Pancreas: Atrophic pancreas. No pancreatic ductal dilation or surrounding inflammatory changes. Spleen: Normal in size without focal abnormality. Adrenals/Urinary Tract: The adrenal glands are unremarkable. Mild left renal cortical atrophy. No suspicious focal lesion. No renal calculi or hydronephrosis. Bladder is unremarkable. Stomach/Bowel: Stomach is within normal limits. Appendix is surgically absent. No evidence of bowel wall thickening, distention, or inflammatory changes. Vascular/Lymphatic: The abdominal aorta is normal in caliber. Aortic atherosclerosis. No enlarged abdominal or pelvic lymph nodes. Reproductive: Uterus and bilateral adnexa are unremarkable. Other: Trace free fluid along the right paracolic gutter (series 3, image 51). No free air. Musculoskeletal: No acute osseous abnormality. No suspicious osseous lesion. Multilevel degenerative changes of the lumbar spine. IMPRESSION: 1. Mildly dilated common bile duct with hyperdense intraluminal material, which could represent small stones, concerning for choledocholithiasis. No intrahepatic biliary ductal dilatation. Further evaluation with ERCP and/or MRCP is recommended. 2. Status post cholecystectomy. 3. Trace free fluid along the right paracolic gutter may be reactive. 4.  Aortic Atherosclerosis (ICD10-I70.0). Electronically Signed   By: Hart Robinsons  M.D.   On: 06/22/2023 13:24   CT Angio Chest PE W and/or Wo Contrast  Result Date: 06/22/2023 CLINICAL DATA:  Pulmonary embolism (PE) suspected, high prob RUQ vs R lower rib pain. EXAM: CT ANGIOGRAPHY CHEST WITH CONTRAST TECHNIQUE: Multidetector CT imaging of the chest was performed using the standard protocol during bolus administration of intravenous contrast. Multiplanar CT image reconstructions and MIPs were obtained to evaluate the vascular anatomy. RADIATION DOSE REDUCTION: This exam was performed according to the departmental dose-optimization program which includes automated exposure control, adjustment of the mA and/or kV according to patient size and/or use of iterative reconstruction technique. CONTRAST:  80mL OMNIPAQUE IOHEXOL 350 MG/ML SOLN COMPARISON:  CT chest dated March 28, 2023. FINDINGS: Cardiovascular: Satisfactory opacification of the pulmonary arteries to the segmental level. No evidence of pulmonary embolism. The heart is mildly enlarged. No pericardial effusion. Multivessel coronary artery calcifications. Thoracic aorta is normal in caliber. Tortuosity of the descending thoracic aorta. Aortic atherosclerosis. Mediastinum/Nodes: No enlarged mediastinal, hilar, or axillary lymph nodes. Thyroid gland, trachea, and esophagus demonstrate no significant findings. Lungs/Pleura: Mild bibasilar linear atelectasis/scarring. Similar patchy interstitial changes in the posteromedial right lower lobe favored to  represent atelectasis and/or scarring. No focal consolidation. No suspicious pulmonary nodule. No pleural effusion or pneumothorax. Upper Abdomen: Please refer to the same-day CT abdomen/pelvis for description of intra-abdominal findings. Musculoskeletal: Pectus excavatum. 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 No acute osseous abnormality. No suspicious osseous lesion. Review of the MIP images confirms the above findings. IMPRESSION: 1. No evidence  of acute pulmonary embolism. 2. No acute intrathoracic findings. 3. Pectus excavatum. 4.  Aortic Atherosclerosis (ICD10-I70.0). Electronically Signed   By: Hart Robinsons M.D.   On: 06/22/2023 12:59   DG Chest 2 View  Result Date: 06/22/2023 CLINICAL DATA:  Weakness. EXAM: CHEST - 2 VIEW COMPARISON:  04/11/2023. FINDINGS: Low lung volume. Redemonstration of linear area of atelectasis and/or scarring at the right lung base. Bilateral lung fields are otherwise clear. No acute consolidation or lung collapse. Bilateral costophrenic angles are clear. Stable moderately enlarged cardio-mediastinal silhouette which is likely accentuated by low lung volume and AP technique. No acute osseous abnormalities. The soft tissues are within normal limits. IMPRESSION: *No active cardiopulmonary disease. Electronically Signed   By: Jules Schick M.D.   On: 06/22/2023 09:54    Assessment:   # Choledocholithiasis  # Fatty liver disease- ? Of nodular findings on imaging  # s/p cholecystectomy  Plan:  Choledocholithiasis requiring ERCP to be performed today NPO since midnight Dvt ppx held Other liver w/u including viral hepatitis, salicylate, tylenol negative Follow up as outpatient for liver lab check Monitor cbc and lfts Further recommendations pending ERCP  Management of other medical comorbidities as per primary team  I personally performed the service.  Thank you for allowing [place] to participate in this patient's care. Please don't hesitate to call if any questions or concerns arise.   Jaynie Collins, DO [facility]  Portions of the record may have been created with voice recognition software. Occasional wrong-word or 'sound-a-like' substitutions may have occurred due to the inherent limitations of voice recognition software.  Read the chart carefully and recognize, using context, where substitutions may have occurred.

## 2023-06-23 NOTE — Op Note (Signed)
[facility] Patient Name: Janet Rios Procedure Date: 06/23/2023 12:24 PM MRN: [id number] Account #: [id number] Date of Birth: 1940-10-04 Admit Type: Outpatient Age: 82 Room: [facility] ENDO ROOM 4 Gender: Female Note Status: Finalized Instrument Name: Nolon Stalls [id number] Procedure:             ERCP Indications:           Common bile duct stone(s) Providers:             Midge Minium MD, MD Referring MD:          Danella Penton, MD (Referring MD) Medicines:             General Anesthesia Complications:         No immediate complications. Procedure:             Pre-Anesthesia Assessment:                        - Prior to the procedure, a History and Physical was                         performed, and patient medications and allergies were                         reviewed. The patient's tolerance of previous                         anesthesia was also reviewed. The risks and benefits                         of the procedure and the sedation options and risks                         were discussed with the patient. All questions were                         answered, and informed consent was obtained. Prior                         Anticoagulants: The patient has taken no anticoagulant                         or antiplatelet agents. ASA Grade Assessment: II - A                         patient with mild systemic disease. After reviewing                         the risks and benefits, the patient was deemed in                         satisfactory condition to undergo the procedure.                        After obtaining informed consent, the scope was passed                         under direct vision. Throughout the procedure, the  patient's blood pressure, pulse, and oxygen                         saturations were monitored continuously. The                         Duodenoscope was introduced through the mouth, and                          used to inject contrast into and used to inject                         contrast into the bile duct. The ERCP was accomplished                         without difficulty. The patient tolerated the                         procedure well. Findings:      The scout film was normal. The esophagus was successfully intubated       under direct vision. The scope was advanced to a normal major papilla in       the descending duodenum without detailed examination of the pharynx,       larynx and associated structures, and upper GI tract. The upper GI tract       was grossly normal. The bile duct was deeply cannulated with the       short-nosed traction sphincterotome. Contrast was injected. I personally       interpreted the bile duct images. There was brisk flow of contrast       through the ducts. Image quality was excellent. Contrast extended to the       entire biliary tree. The main bile duct contained multiple stones. A       wire was passed into the biliary tree. Biliary sphincterotomy was made       with a traction (standard) sphincterotome using ERBE electrocautery.       There was no post-sphincterotomy bleeding. The biliary tree was swept       with a 15 mm balloon starting at the bifurcation. Sludge was swept from       the duct. All stones were removed. Nothing was found. Impression:            - Choledocholithiasis was found. Complete removal was                         accomplished by biliary sphincterotomy and balloon                         extraction.                        - A biliary sphincterotomy was performed.                        - The biliary tree was swept and nothing was found. Recommendation:        - Return patient to hospital ward for ongoing care.                        - Clear liquid diet.                        -  Watch for pancreatitis, bleeding, perforation, and                         cholangitis. Procedure Code(s):     --- Professional ---                         [phone], Endoscopic retrograde cholangiopancreatography                         (ERCP); with removal of calculi/debris from                         biliary/pancreatic duct(s)                        [id number], Endoscopic retrograde cholangiopancreatography                         (ERCP); with sphincterotomy/papillotomy                        [phone], Endoscopic catheterization of the biliary                         ductal system, radiological supervision and                         interpretation Diagnosis Code(s):     --- Professional ---                        K80.50, Calculus of bile duct without cholangitis or                         cholecystitis without obstruction CPT copyright 2022 American Medical Association. All rights reserved. The codes documented in this report are preliminary and upon coder review may  be revised to meet current compliance requirements. Midge Minium MD, MD 06/23/2023 1:03:37 PM This report has been signed electronically. Number of Addenda: 0 Note Initiated On: 06/23/2023 12:24 PM Estimated Blood Loss:  Estimated blood loss: none.      [facility]

## 2023-06-24 DIAGNOSIS — K8050 Calculus of bile duct without cholangitis or cholecystitis without obstruction: Principal | ICD-10-CM

## 2023-06-24 LAB — CBC
HCT: 37.6 % (ref 36.0–46.0)
Hemoglobin: 12.4 g/dL (ref 12.0–15.0)
MCH: 30.5 pg (ref 26.0–34.0)
MCHC: 33 g/dL (ref 30.0–36.0)
MCV: 92.4 fL (ref 80.0–100.0)
Platelets: 224 10*3/uL (ref 150–400)
RBC: 4.07 MIL/uL (ref 3.87–5.11)
RDW: 12.4 % (ref 11.5–15.5)
WBC: 5 10*3/uL (ref 4.0–10.5)
nRBC: 0 % (ref 0.0–0.2)

## 2023-06-24 LAB — COMPREHENSIVE METABOLIC PANEL
ALT: 159 U/L — ABNORMAL HIGH (ref 0–44)
AST: 169 U/L — ABNORMAL HIGH (ref 15–41)
Albumin: 3 g/dL — ABNORMAL LOW (ref 3.5–5.0)
Alkaline Phosphatase: 177 U/L — ABNORMAL HIGH (ref 38–126)
Anion gap: 9 (ref 5–15)
BUN: 19 mg/dL (ref 8–23)
CO2: 24 mmol/L (ref 22–32)
Calcium: 8.9 mg/dL (ref 8.9–10.3)
Chloride: 103 mmol/L (ref 98–111)
Creatinine, Ser: 1.12 mg/dL — ABNORMAL HIGH (ref 0.44–1.00)
GFR, Estimated: 49 mL/min — ABNORMAL LOW (ref >60)
Glucose, Bld: 128 mg/dL — ABNORMAL HIGH (ref 70–99)
Potassium: 3.5 mmol/L (ref 3.5–5.1)
Sodium: 136 mmol/L (ref 135–145)
Total Bilirubin: 4.3 mg/dL — ABNORMAL HIGH (ref <1.2)
Total Protein: 6.5 g/dL (ref 6.5–8.1)

## 2023-06-24 LAB — LACTIC ACID, PLASMA: Lactic Acid, Venous: 1.6 mmol/L (ref 0.5–1.9)

## 2023-06-24 LAB — MAGNESIUM: Magnesium: 1.9 mg/dL (ref 1.7–2.4)

## 2023-06-24 MED ORDER — FLUTICASONE PROPIONATE 50 MCG/ACT NA SUSP
1.0000 | Freq: Every day | NASAL | Status: DC
  Administered 2023-06-24 – 2023-06-25 (×2): 1 via NASAL
  Filled 2023-06-24: qty 16

## 2023-06-24 MED ORDER — MENTHOL 3 MG MT LOZG
1.0000 | LOZENGE | OROMUCOSAL | Status: DC | PRN
  Administered 2023-06-24 – 2023-06-25 (×4): 3 mg via ORAL
  Filled 2023-06-24: qty 9

## 2023-06-24 NOTE — Plan of Care (Signed)
Problem: Education: Goal: Knowledge of General Education information will improve Description: Including pain rating scale, medication(s)/side effects and non-pharmacologic comfort measures Outcome: Progressing   Problem: Activity: Goal: Risk for activity intolerance will decrease Outcome: Progressing   Problem: Nutrition: Goal: Adequate nutrition will be maintained Outcome: Progressing   Problem: Elimination: Goal: Will not experience complications related to bowel motility Outcome: Progressing Goal: Will not experience complications related to urinary retention Outcome: Progressing   Problem: Safety: Goal: Ability to remain free from injury will improve Outcome: Progressing   Problem: Pain Management: Goal: General experience of comfort will improve Outcome: Progressing

## 2023-06-24 NOTE — Progress Notes (Signed)
  PROGRESS NOTE    Janet Rios  MVH:[id number] DOB: 08-Apr-1941 DOA: 06/22/2023 PCP: Danella Penton, MD  212A/212A-AA  LOS: 2 days   Brief hospital course:   Assessment & Plan: Janet Rios is a 82 y.o. female with medical history significant for HTN, COPD who is s/p cholecystectomy 04/12/2023, who presents to the ED With a 2-day history of low-grade fevers, nausea, and post prandial right upper quadrant pain radiating to her back.    * Choledocholithiasis S/p laparoscopic cholecystectomy 04/12/2023 S/p ERCP on 11/22 Continue cefepime and Flagyl --monitor for complications  Chronic obstructive pulmonary disease (COPD) (HCC) Not acutely exacerbated --cont home daily bronchodilators  Hypertension BP elevated, likely related to pain --cont home Toprol  Mild trop elevation 2/2 demand ischemia --trop 44, 63  Lactic acidosis --resolved with IVF --oral hydration now   DVT prophylaxis: Lovenox SQ Code Status: Full code  Family Communication:  Level of care: Telemetry Medical Dispo:   The patient is from: home Anticipated d/c is to: home Anticipated d/c date is: 1-2 days   Subjective and Interval History:  Pt reported no upper abdominal pain.  Tolerated diet.   Objective: Vitals:   06/24/23 0500 06/24/23 0756 06/24/23 1015 06/24/23 1428  BP: (!) 151/74 (!) 171/77 (!) 149/63 (!) 150/68  Pulse: 74 76 77 71  Resp: 18 18  18   Temp: 98.2 F (36.8 C) 98 F (36.7 C)  98.5 F (36.9 C)  TempSrc: Oral Oral  Oral  SpO2: 95% 94%  97%  Weight:      Height:        Intake/Output Summary (Last 24 hours) at 06/24/2023 1852 Last data filed at 06/24/2023 0150 Gross per 24 hour  Intake 400 ml  Output --  Net 400 ml   Filed Weights   06/22/23 0823 06/22/23 2344  Weight: 77.1 kg 77.2 kg    Examination:   Constitutional: NAD, AAOx3 HEENT: conjunctivae and lids normal, EOMI CV: No cyanosis.   RESP: normal respiratory effort, on RA Neuro: II - XII grossly intact.    Psych: Normal mood and affect.  Appropriate judgement and reason   Data Reviewed: I have personally reviewed labs and imaging studies  Time spent: 35 minutes  Darlin Priestly, MD [facility] If 7PM-7AM, please contact night-coverage 06/24/2023, 6:52 PM

## 2023-06-25 DIAGNOSIS — K8050 Calculus of bile duct without cholangitis or cholecystitis without obstruction: Secondary | ICD-10-CM | POA: Diagnosis not present

## 2023-06-25 LAB — COMPREHENSIVE METABOLIC PANEL
ALT: 141 U/L — ABNORMAL HIGH (ref 0–44)
AST: 127 U/L — ABNORMAL HIGH (ref 15–41)
Albumin: 3.1 g/dL — ABNORMAL LOW (ref 3.5–5.0)
Alkaline Phosphatase: 189 U/L — ABNORMAL HIGH (ref 38–126)
Anion gap: 10 (ref 5–15)
BUN: 12 mg/dL (ref 8–23)
CO2: 25 mmol/L (ref 22–32)
Calcium: 9 mg/dL (ref 8.9–10.3)
Chloride: 103 mmol/L (ref 98–111)
Creatinine, Ser: 0.97 mg/dL (ref 0.44–1.00)
GFR, Estimated: 59 mL/min — ABNORMAL LOW (ref >60)
Glucose, Bld: 120 mg/dL — ABNORMAL HIGH (ref 70–99)
Potassium: 3.8 mmol/L (ref 3.5–5.1)
Sodium: 138 mmol/L (ref 135–145)
Total Bilirubin: 2.6 mg/dL — ABNORMAL HIGH (ref <1.2)
Total Protein: 6.6 g/dL (ref 6.5–8.1)

## 2023-06-25 LAB — MAGNESIUM: Magnesium: 1.9 mg/dL (ref 1.7–2.4)

## 2023-06-25 LAB — CBC
HCT: 38.7 % (ref 36.0–46.0)
Hemoglobin: 13 g/dL (ref 12.0–15.0)
MCH: 30.6 pg (ref 26.0–34.0)
MCHC: 33.6 g/dL (ref 30.0–36.0)
MCV: 91.1 fL (ref 80.0–100.0)
Platelets: 253 10*3/uL (ref 150–400)
RBC: 4.25 MIL/uL (ref 3.87–5.11)
RDW: 12.6 % (ref 11.5–15.5)
WBC: 5.4 10*3/uL (ref 4.0–10.5)
nRBC: 0 % (ref 0.0–0.2)

## 2023-06-25 MED ORDER — POLYETHYLENE GLYCOL 3350 17 G PO PACK
17.0000 g | PACK | Freq: Two times a day (BID) | ORAL | Status: DC
  Administered 2023-06-25: 17 g via ORAL
  Filled 2023-06-25 (×3): qty 1

## 2023-06-25 MED ORDER — LOSARTAN POTASSIUM 50 MG PO TABS
50.0000 mg | ORAL_TABLET | Freq: Every day | ORAL | Status: DC
  Administered 2023-06-25 – 2023-06-26 (×2): 50 mg via ORAL
  Filled 2023-06-25 (×2): qty 1

## 2023-06-25 NOTE — Progress Notes (Signed)
  PROGRESS NOTE    [name]  ZOX:[id number] DOB: [birth year] DOA: 06/22/2023 PCP: Danella Penton, MD  212A/212A-AA  LOS: 3 days   Brief hospital course:   Assessment & Plan: Janet Rios is a 82 y.o. female with medical history significant for HTN, COPD who is s/p cholecystectomy 04/12/2023, who presents to the ED With a 2-day history of low-grade fevers, nausea, and post prandial right upper quadrant pain radiating to her back.    * Choledocholithiasis S/p laparoscopic cholecystectomy 04/12/2023 S/p ERCP on 11/22 --d/c empiric cefe and flagyl --monitor for complications  Chronic obstructive pulmonary disease (COPD) (HCC) Not acutely exacerbated --cont home daily bronchodilators  Hypertension BP elevated, likely related to pain --cont home Toprol --add losartan 50 mg daily  Mild trop elevation 2/2 demand ischemia --trop 44, 63  Lactic acidosis --resolved with IVF --oral hydration now   DVT prophylaxis: Lovenox SQ Code Status: Full code  Family Communication: daughter updated at bedside today Level of care: Telemetry Medical Dispo:   The patient is from: home Anticipated d/c is to: home Anticipated d/c date is: tomorrow   Subjective and Interval History:  No upper abdominal pain, but had brief wave of abdominal cramp feeling over lower abdomen.   Objective: Vitals:   06/24/23 2116 06/25/23 0323 06/25/23 0944 06/25/23 1618  BP: (!) 160/105 (!) 162/65 (!) 172/84 (!) 149/67  Pulse: 82 85 93 81  Resp: 20 20 18 18   Temp:  98.1 F (36.7 C) 98.6 F (37 C) 98.1 F (36.7 C)  TempSrc:  Oral Oral Oral  SpO2: 97% 93% 94% 95%  Weight:      Height:       No intake or output data in the 24 hours ending 06/25/23 2031  Filed Weights   06/22/23 0823 06/22/23 2344  Weight: 77.1 kg 77.2 kg    Examination:   Constitutional: NAD, AAOx3 HEENT: conjunctivae and lids normal, EOMI CV: No cyanosis.   RESP: normal respiratory effort, on RA Neuro: II - XII grossly  intact.   Psych: Normal mood and affect.  Appropriate judgement and reason   Data Reviewed: I have personally reviewed labs and imaging studies  Time spent: 35 minutes  Darlin Priestly, MD [facility] If 7PM-7AM, please contact night-coverage 06/25/2023, 8:31 PM

## 2023-06-26 DIAGNOSIS — K8050 Calculus of bile duct without cholangitis or cholecystitis without obstruction: Secondary | ICD-10-CM | POA: Diagnosis not present

## 2023-06-26 LAB — CBC
HCT: 40.2 % (ref 36.0–46.0)
Hemoglobin: 13.6 g/dL (ref 12.0–15.0)
MCH: 30.3 pg (ref 26.0–34.0)
MCHC: 33.8 g/dL (ref 30.0–36.0)
MCV: 89.5 fL (ref 80.0–100.0)
Platelets: 284 10*3/uL (ref 150–400)
RBC: 4.49 MIL/uL (ref 3.87–5.11)
RDW: 12.5 % (ref 11.5–15.5)
WBC: 6.2 10*3/uL (ref 4.0–10.5)
nRBC: 0 % (ref 0.0–0.2)

## 2023-06-26 LAB — COMPREHENSIVE METABOLIC PANEL
ALT: 108 U/L — ABNORMAL HIGH (ref 0–44)
AST: 83 U/L — ABNORMAL HIGH (ref 15–41)
Albumin: 3.1 g/dL — ABNORMAL LOW (ref 3.5–5.0)
Alkaline Phosphatase: 182 U/L — ABNORMAL HIGH (ref 38–126)
Anion gap: 8 (ref 5–15)
BUN: 15 mg/dL (ref 8–23)
CO2: 26 mmol/L (ref 22–32)
Calcium: 9 mg/dL (ref 8.9–10.3)
Chloride: 102 mmol/L (ref 98–111)
Creatinine, Ser: 0.87 mg/dL (ref 0.44–1.00)
GFR, Estimated: >60 mL/min (ref >60)
Glucose, Bld: 122 mg/dL — ABNORMAL HIGH (ref 70–99)
Potassium: 3.7 mmol/L (ref 3.5–5.1)
Sodium: 136 mmol/L (ref 135–145)
Total Bilirubin: 1.9 mg/dL — ABNORMAL HIGH (ref <1.2)
Total Protein: 7 g/dL (ref 6.5–8.1)

## 2023-06-26 LAB — MAGNESIUM: Magnesium: 1.8 mg/dL (ref 1.7–2.4)

## 2023-06-26 LAB — LIPASE, BLOOD: Lipase: 26 U/L (ref 11–51)

## 2023-06-26 MED ORDER — LOSARTAN POTASSIUM 50 MG PO TABS: 50.0000 mg | ORAL_TABLET | Freq: Every day | ORAL | 0 refills | Status: DC

## 2023-06-26 MED ORDER — LOSARTAN POTASSIUM 50 MG PO TABS: 50.0000 mg | ORAL_TABLET | Freq: Every day | ORAL | 0 refills | Status: AC

## 2023-06-26 NOTE — Plan of Care (Signed)
?  Problem: Clinical Measurements: ?Goal: Will remain free from infection ?Outcome: Progressing ?  ?

## 2023-06-26 NOTE — Discharge Summary (Signed)
Physician Discharge Summary   Janet Rios  female DOB: 1941-03-05  GNF:[id number]  PCP: Danella Penton, MD  Admit date: 06/22/2023 Discharge date: 06/26/2023  Admitted From: home Disposition:  home Daughter updated at bedside prior to discharge. CODE STATUS: Full code   Hospital Course:  For full details, please see H&P, progress notes, consult notes and ancillary notes.  Briefly,  Janet Rios is a 82 y.o. female with medical history significant for HTN, COPD who is s/p cholecystectomy 04/12/2023, who presents to the ED with a 2-day history of low-grade fevers, nausea, and post prandial right upper quadrant pain radiating to her back.    * Choledocholithiasis S/p laparoscopic cholecystectomy 04/12/2023 S/p ERCP on 11/22 --MRCP showed "Choledocholithiasis with numerous stones along the course of the common duct measuring up to 12 mm." --ERCP shoed "Choledocholithiasis was found. Complete removal was accomplished by biliary sphincterotomy and balloon extraction."  Pt was monitored for complications post ERCP and did well.  Pt received empiric cefe and flagyl during hospitalization which were not continued after discharge.   Chronic obstructive pulmonary disease (COPD) (HCC) Not acutely exacerbated --cont home daily bronchodilators   Hypertension BP elevated. --cont home Toprol --added losartan 50 mg daily   Mild trop elevation 2/2 demand ischemia --trop 44, 63   Lactic acidosis --resolved with IVF   Unless noted above, medications under "STOP" list are ones pt was not taking PTA.  Discharge Diagnoses:  Principal Problem:   Choledocholithiasis Active Problems:   Fatty liver   S/P laparoscopic cholecystectomy 04/12/23   Prolonged QT interval   Chronic obstructive pulmonary disease (COPD) (HCC)   Hypertension   30 Day Unplanned Readmission Risk Score    Flowsheet Row ED to Hosp-Admission (Current) from 06/22/2023 in [facility]  30 Day Unplanned Readmission Risk Score (%) 16.37 Filed at 06/26/2023 0401       This score is the patient's risk of an unplanned readmission within 30 days of being discharged (0 -100%). The score is based on dignosis, age, lab data, medications, orders, and past utilization.   Low:  0-14.9   Medium: 15-21.9   High: 22-29.9   Extreme: 30 and above         Discharge Instructions:  Allergies as of 06/26/2023       Reactions   Alendronate Other (See Comments)   Joint pain   Statins Other (See Comments)   Muscle pain   Amoxicillin Rash        Medication List     STOP taking these medications    ALPRAZolam 0.25 MG tablet Commonly known as: XANAX   oxyCODONE 5 MG immediate release tablet Commonly known as: Roxicodone   Simethicone 80 MG Tabs       TAKE these medications    albuterol 108 (90 Base) MCG/ACT inhaler Commonly known as: VENTOLIN HFA Inhale 2 puffs into the lungs every 4 (four) hours as needed for wheezing or shortness of breath.   aspirin 81 MG tablet Take 1 tablet (81 mg total) by mouth daily.   bisacodyl 5 MG EC tablet Commonly known as: Dulcolax Take 1 tablet (5 mg total) by mouth daily as needed for moderate constipation.   CALCIUM 600 PO Take 1 tablet by mouth daily.   COENZYME Q10 PO Take 2,000 mg by mouth at bedtime.   fexofenadine 180 MG tablet Commonly known as: ALLEGRA Take 180 mg by mouth daily.   fluticasone 50 MCG/ACT nasal spray Commonly known  as: FLONASE Place into both nostrils daily.   folic acid 400 MCG tablet Commonly known as: FOLVITE Take 800 mcg by mouth daily.   losartan 50 MG tablet Commonly known as: COZAAR Take 1 tablet (50 mg total) by mouth daily.   metoprolol succinate 25 MG 24 hr tablet Commonly known as: TOPROL-XL Take 25 mg by mouth every evening.   metoprolol succinate 50 MG 24 hr tablet Commonly known as: TOPROL-XL Take 2 tablets by mouth every morning.   montelukast 10 MG  tablet Commonly known as: SINGULAIR Take 10 mg by mouth daily.   pantoprazole 40 MG tablet Commonly known as: PROTONIX Take 40 mg by mouth daily. Reported on 07/31/2015   polyethylene glycol 17 g packet Commonly known as: MiraLax Take 17 g by mouth daily as needed for moderate constipation.   Trelegy Ellipta 100-62.5-25 MCG/ACT Aepb Generic drug: Fluticasone-Umeclidin-Vilant Inhale 1 puff into the lungs daily.   vitamin B-12 250 MCG tablet Commonly known as: CYANOCOBALAMIN Take 250 mcg by mouth at bedtime.   Vitamin D (Ergocalciferol) 1.25 MG (50000 UNIT) Caps capsule Commonly known as: DRISDOL Take 50,000 Units by mouth every 7 (seven) days.   Vitamin D3 50 MCG (2000 UT) capsule Take 2,000 Units by mouth daily.         Follow-up Information     Danella Penton, MD. Go on 07/04/2023.   Specialty: Internal Medicine Why: Go at 11:30am. Contact information: [address] [facility] [place] [facility] [place] [address] [phone]                 Allergies  Allergen Reactions   Alendronate Other (See Comments)    Joint pain   Statins Other (See Comments)    Muscle pain   Amoxicillin Rash     The results of significant diagnostics from this hospitalization (including imaging, microbiology, ancillary and laboratory) are listed below for reference.   Consultations:   Procedures/Studies: DG C-Arm 1-60 Min-No Report  Result Date: 06/23/2023 Fluoroscopy was utilized by the requesting physician.  No radiographic interpretation.   MR 3D Recon At Scanner  Result Date: 06/22/2023 CLINICAL DATA:  Nonspecific (abnormal) findings on radiological and other examination of musculoskeletal system biliary tree EXAM: 3-DIMENSIONAL MR IMAGE RENDERING ON ACQUISITION WORKSTATION TECHNIQUE: 3-dimensional MR images were rendered by post-processing of the original MR data on an acquisition workstation. The 3-dimensional MR images were interpreted and  findings were reported in the accompanying complete MR report for this study COMPARISON:  CT abdomen pelvis 06/22/2023 earlier. Ultrasound 04/11/2023. Chest CT 03/28/2023. FINDINGS: Lower chest: Pectus excavatum seen. The heart enlarged. No significant pleural effusion the lung bases. Bandlike linear changes at the bases as well. Scar or atelectasis. Please correlate with prior CT. Hepatobiliary: The liver has patchy areas of signal drop on out of phase imaging consistent with components of fatty infiltration. Slightly nodular contours of the liver as well. No areas of restricted diffusion in the liver. No abnormal T2 signal. Gallbladder is absent. The common duct measures up to 12 mm in diameter. Slight left-sided intrahepatic biliary ductal dilatation. There are several filling defects along the course of the common duct consistent with choledocholithiasis. At least 5 lesions are seen. There is some mild edema in the porta hepatis. Largest measures up to 12 mm. The duct does taper towards the pancreatic head. Pancreas: Moderate atrophy of the pancreas. No significant pancreatic ductal dilatation. No restricted diffusion. Spleen: Within normal limits in size and appearance. Small anterior splenule. Adrenals/Urinary Tract:  Adrenal glands are preserved. Mild bilateral renal atrophy. Nonspecific perinephric stranding. No obvious mass on this noncontrast examination. Stomach/Bowel: Visualized portions of the bowel are nondilated. Third portion duodenal diverticula. Additional small bowel diverticula more distally in the midabdomen. Vascular/Lymphatic: Circumaortic left renal vein. Normal caliber aorta and IVC. No specific abnormal lymph node enlargement identified in the visualized abdomen. Other: No significant ascites. The cystic area in the right pericolic gutter is again identified and appears simple by noncontrast MRI. Cephalocaudal length of 4.3 cm. Musculoskeletal: Curvature and degenerative changes along the  spine. Motion seen throughout the examination. IMPRESSION: Choledocholithiasis with numerous stones along the course of the common duct measuring up to 12 mm. The common duct is ectatic at 12 mm but does taper towards the pancreatic head. Only slight intrahepatic biliary ductal dilatation particularly along the left side. Prior cholecystectomy. Simple appearing areas of fluid or cystic lesion in the right pericolic gutter as seen on CT. Please correlate with the history. Fatty liver infiltration with slightly nodular contour. Evaluation limited by motion. Electronically Signed   By: Karen Kays M.D.   On: 06/22/2023 21:22   MR ABDOMEN MRCP WO CONTRAST  Result Date: 06/22/2023 CLINICAL DATA:  Worsening abdominal pain.  Weakness. EXAM: MRI ABDOMEN WITHOUT CONTRAST  (INCLUDING MRCP) TECHNIQUE: Multiplanar multisequence MR imaging of the abdomen was performed. Heavily T2-weighted images of the biliary and pancreatic ducts were obtained, and three-dimensional MRCP images were rendered by post processing. COMPARISON:  CT abdomen pelvis 06/22/2023 earlier. Ultrasound 04/11/2023. Chest CT 03/28/2023. FINDINGS: Lower chest: Pectus excavatum seen. The heart enlarged. No significant pleural effusion the lung bases. Bandlike linear changes at the bases as well. Scar or atelectasis. Please correlate with prior CT. Hepatobiliary: The liver has patchy areas of signal drop on out of phase imaging consistent with components of fatty infiltration. Slightly nodular contours of the liver as well. No areas of restricted diffusion in the liver. No abnormal T2 signal. Gallbladder is absent. The common duct measures up to 12 mm in diameter. Slight left-sided intrahepatic biliary ductal dilatation. There are several filling defects along the course of the common duct consistent with choledocholithiasis. At least 5 lesions are seen. There is some mild edema in the porta hepatis. Largest measures up to 12 mm. The duct does taper towards  the pancreatic head. Pancreas: Moderate atrophy of the pancreas. No significant pancreatic ductal dilatation. No restricted diffusion. Spleen: Within normal limits in size and appearance. Small anterior splenule. Adrenals/Urinary Tract: Adrenal glands are preserved. Mild bilateral renal atrophy. Nonspecific perinephric stranding. No obvious mass on this noncontrast examination. Stomach/Bowel: Visualized portions of the bowel are nondilated. Third portion duodenal diverticula. Additional small bowel diverticula more distally in the midabdomen. Vascular/Lymphatic: Circumaortic left renal vein. Normal caliber aorta and IVC. No specific abnormal lymph node enlargement identified in the visualized abdomen. Other: No significant ascites. The cystic area in the right pericolic gutter is again identified and appears simple by noncontrast MRI. Cephalocaudal length of 4.3 cm. Musculoskeletal: Curvature and degenerative changes along the spine. Motion seen throughout the examination. IMPRESSION: Choledocholithiasis with numerous stones along the course of the common duct measuring up to 12 mm. The common duct is ectatic at 12 mm but does taper towards the pancreatic head. Only slight intrahepatic biliary ductal dilatation particularly along the left side. Prior cholecystectomy. Simple appearing areas of fluid or cystic lesion in the right pericolic gutter as seen on CT. Please correlate with the history. Fatty liver infiltration with slightly nodular contour. Evaluation limited by  motion. Electronically Signed   By: Karen Kays M.D.   On: 06/22/2023 21:18   CT ABDOMEN PELVIS W CONTRAST  Result Date: 06/22/2023 CLINICAL DATA:  Biliary obstruction suspected. Generalized weakness. Vomiting. EXAM: CT ABDOMEN AND PELVIS WITH CONTRAST TECHNIQUE: Multidetector CT imaging of the abdomen and pelvis was performed using the standard protocol following bolus administration of intravenous contrast. RADIATION DOSE REDUCTION: This exam  was performed according to the departmental dose-optimization program which includes automated exposure control, adjustment of the mA and/or kV according to patient size and/or use of iterative reconstruction technique. CONTRAST:  80mL OMNIPAQUE IOHEXOL 350 MG/ML SOLN COMPARISON:  None Available. FINDINGS: Lower chest: Please refer to the same-day CT chest for description of intrathoracic findings. Hepatobiliary: No focal liver abnormality is seen. Status post cholecystectomy. Prominence of the common bile duct measuring up to 9 mm (series 6, image 44). There is hyperdense intraluminal material within the common bile duct. No intrahepatic biliary dilatation. Pancreas: Atrophic pancreas. No pancreatic ductal dilation or surrounding inflammatory changes. Spleen: Normal in size without focal abnormality. Adrenals/Urinary Tract: The adrenal glands are unremarkable. Mild left renal cortical atrophy. No suspicious focal lesion. No renal calculi or hydronephrosis. Bladder is unremarkable. Stomach/Bowel: Stomach is within normal limits. Appendix is surgically absent. No evidence of bowel wall thickening, distention, or inflammatory changes. Vascular/Lymphatic: The abdominal aorta is normal in caliber. Aortic atherosclerosis. No enlarged abdominal or pelvic lymph nodes. Reproductive: Uterus and bilateral adnexa are unremarkable. Other: Trace free fluid along the right paracolic gutter (series 3, image 51). No free air. Musculoskeletal: No acute osseous abnormality. No suspicious osseous lesion. Multilevel degenerative changes of the lumbar spine. IMPRESSION: 1. Mildly dilated common bile duct with hyperdense intraluminal material, which could represent small stones, concerning for choledocholithiasis. No intrahepatic biliary ductal dilatation. Further evaluation with ERCP and/or MRCP is recommended. 2. Status post cholecystectomy. 3. Trace free fluid along the right paracolic gutter may be reactive. 4.  Aortic  Atherosclerosis (ICD10-I70.0). Electronically Signed   By: Hart Robinsons M.D.   On: 06/22/2023 13:24   CT Angio Chest PE W and/or Wo Contrast  Result Date: 06/22/2023 CLINICAL DATA:  Pulmonary embolism (PE) suspected, high prob RUQ vs R lower rib pain. EXAM: CT ANGIOGRAPHY CHEST WITH CONTRAST TECHNIQUE: Multidetector CT imaging of the chest was performed using the standard protocol during bolus administration of intravenous contrast. Multiplanar CT image reconstructions and MIPs were obtained to evaluate the vascular anatomy. RADIATION DOSE REDUCTION: This exam was performed according to the departmental dose-optimization program which includes automated exposure control, adjustment of the mA and/or kV according to patient size and/or use of iterative reconstruction technique. CONTRAST:  80mL OMNIPAQUE IOHEXOL 350 MG/ML SOLN COMPARISON:  CT chest dated March 28, 2023. FINDINGS: Cardiovascular: Satisfactory opacification of the pulmonary arteries to the segmental level. No evidence of pulmonary embolism. The heart is mildly enlarged. No pericardial effusion. Multivessel coronary artery calcifications. Thoracic aorta is normal in caliber. Tortuosity of the descending thoracic aorta. Aortic atherosclerosis. Mediastinum/Nodes: No enlarged mediastinal, hilar, or axillary lymph nodes. Thyroid gland, trachea, and esophagus demonstrate no significant findings. Lungs/Pleura: Mild bibasilar linear atelectasis/scarring. Similar patchy interstitial changes in the posteromedial right lower lobe favored to represent atelectasis and/or scarring. No focal consolidation. No suspicious pulmonary nodule. No pleural effusion or pneumothorax. Upper Abdomen: Please refer to the same-day CT abdomen/pelvis for description of intra-abdominal findings. Musculoskeletal: Pectus excavatum. 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 No acute osseous abnormality. No suspicious osseous lesion.  Review of the  MIP images confirms the above findings. IMPRESSION: 1. No evidence of acute pulmonary embolism. 2. No acute intrathoracic findings. 3. Pectus excavatum. 4.  Aortic Atherosclerosis (ICD10-I70.0). Electronically Signed   By: Hart Robinsons M.D.   On: 06/22/2023 12:59   DG Chest 2 View  Result Date: 06/22/2023 CLINICAL DATA:  Weakness. EXAM: CHEST - 2 VIEW COMPARISON:  04/11/2023. FINDINGS: Low lung volume. Redemonstration of linear area of atelectasis and/or scarring at the right lung base. Bilateral lung fields are otherwise clear. No acute consolidation or lung collapse. Bilateral costophrenic angles are clear. Stable moderately enlarged cardio-mediastinal silhouette which is likely accentuated by low lung volume and AP technique. No acute osseous abnormalities. The soft tissues are within normal limits. IMPRESSION: *No active cardiopulmonary disease. Electronically Signed   By: Jules Schick M.D.   On: 06/22/2023 09:54      Labs: BNP (last 3 results) No results for input(s): "BNP" in the last 8760 hours. Basic Metabolic Panel: Recent Labs  Lab 06/22/23 0827 06/22/23 1023 06/24/23 0442 06/25/23 0751 06/26/23 0548  NA 135  --  136 138 136  K 3.6  --  3.5 3.8 3.7  CL 98  --  103 103 102  CO2 24  --  24 25 26   GLUCOSE 146*  --  128* 120* 122*  BUN 20  --  19 12 15   CREATININE 0.98  --  1.12* 0.97 0.87  CALCIUM 9.8  --  8.9 9.0 9.0  MG  --  1.9 1.9 1.9 1.8   Liver Function Tests: Recent Labs  Lab 06/22/23 0827 06/24/23 0442 06/25/23 0751 06/26/23 0548  AST 511* 169* 127* 83*  ALT 225* 159* 141* 108*  ALKPHOS 264* 177* 189* 182*  BILITOT 2.4* 4.3* 2.6* 1.9*  PROT 7.7 6.5 6.6 7.0  ALBUMIN 3.8 3.0* 3.1* 3.1*   Recent Labs  Lab 06/22/23 1611 06/26/23 0548  LIPASE 25 26   No results for input(s): "AMMONIA" in the last 168 hours. CBC: Recent Labs  Lab 06/22/23 0827 06/24/23 0442 06/25/23 0751 06/26/23 0548  WBC 10.0 5.0 5.4 6.2  HGB 14.9 12.4 13.0  13.6  HCT 44.0 37.6 38.7 40.2  MCV 90.5 92.4 91.1 89.5  PLT 262 224 253 284   Cardiac Enzymes: No results for input(s): "CKTOTAL", "CKMB", "CKMBINDEX", "TROPONINI" in the last 168 hours. BNP: Invalid input(s): "POCBNP" CBG: No results for input(s): "GLUCAP" in the last 168 hours. D-Dimer No results for input(s): "DDIMER" in the last 72 hours. Hgb A1c No results for input(s): "HGBA1C" in the last 72 hours. Lipid Profile No results for input(s): "CHOL", "HDL", "LDLCALC", "TRIG", "CHOLHDL", "LDLDIRECT" in the last 72 hours. Thyroid function studies No results for input(s): "TSH", "T4TOTAL", "T3FREE", "THYROIDAB" in the last 72 hours.  Invalid input(s): "FREET3" Anemia work up No results for input(s): "VITAMINB12", "FOLATE", "FERRITIN", "TIBC", "IRON", "RETICCTPCT" in the last 72 hours. Urinalysis    Component Value Date/Time   COLORURINE YELLOW (A) 06/22/2023 1216   APPEARANCEUR CLEAR (A) 06/22/2023 1216   LABSPEC 1.030 06/22/2023 1216   PHURINE 7.0 06/22/2023 1216   GLUCOSEU NEGATIVE 06/22/2023 1216   HGBUR NEGATIVE 06/22/2023 1216   BILIRUBINUR NEGATIVE 06/22/2023 1216   KETONESUR NEGATIVE 06/22/2023 1216   PROTEINUR NEGATIVE 06/22/2023 1216   NITRITE NEGATIVE 06/22/2023 1216   LEUKOCYTESUR NEGATIVE 06/22/2023 1216   Sepsis Labs Recent Labs  Lab 06/22/23 0827 06/24/23 0442 06/25/23 0751 06/26/23 0548  WBC 10.0 5.0 5.4 6.2   Microbiology Recent Results (from the past 240  hour(s))  Resp panel by RT-PCR (RSV, Flu A&B, Covid) Anterior Nasal Swab     Status: None   Collection Time: 06/22/23 10:23 AM   Specimen: Anterior Nasal Swab  Result Value Ref Range Status   SARS Coronavirus 2 by RT PCR NEGATIVE NEGATIVE Final    Comment: (NOTE) SARS-CoV-2 target nucleic acids are NOT DETECTED.  The SARS-CoV-2 RNA is generally detectable in upper respiratory specimens during the acute phase of infection. The lowest concentration of SARS-CoV-2 viral copies this assay can  detect is 138 copies/mL. A negative result does not preclude SARS-Cov-2 infection and should not be used as the sole basis for treatment or other patient management decisions. A negative result may occur with  improper specimen collection/handling, submission of specimen other than nasopharyngeal swab, presence of viral mutation(s) within the areas targeted by this assay, and inadequate number of viral copies(<138 copies/mL). A negative result must be combined with clinical observations, patient history, and epidemiological information. The expected result is Negative.  Fact Sheet for Patients:  BloggerCourse.com  Fact Sheet for Healthcare Providers:  SeriousBroker.it  This test is no t yet approved or cleared by the [place] FDA and  has been authorized for detection and/or diagnosis of SARS-CoV-2 by FDA under an Emergency Use Authorization (EUA). This EUA will remain  in effect (meaning this test can be used) for the duration of the COVID-19 declaration under Section 564(b)(1) of the Act, 21 U.S.C.section 360bbb-3(b)(1), unless the authorization is terminated  or revoked sooner.       Influenza A by PCR NEGATIVE NEGATIVE Final   Influenza B by PCR NEGATIVE NEGATIVE Final    Comment: (NOTE) The Xpert Xpress SARS-CoV-2/FLU/RSV plus assay is intended as an aid in the diagnosis of influenza from Nasopharyngeal swab specimens and should not be used as a sole basis for treatment. Nasal washings and aspirates are unacceptable for Xpert Xpress SARS-CoV-2/FLU/RSV testing.  Fact Sheet for Patients: BloggerCourse.com  Fact Sheet for Healthcare Providers: SeriousBroker.it  This test is not yet approved or cleared by the [place] FDA and has been authorized for detection and/or diagnosis of SARS-CoV-2 by FDA under an Emergency Use Authorization (EUA). This EUA will remain in  effect (meaning this test can be used) for the duration of the COVID-19 declaration under Section 564(b)(1) of the Act, 21 U.S.C. section 360bbb-3(b)(1), unless the authorization is terminated or revoked.     Resp Syncytial Virus by PCR NEGATIVE NEGATIVE Final    Comment: (NOTE) Fact Sheet for Patients: BloggerCourse.com  Fact Sheet for Healthcare Providers: SeriousBroker.it  This test is not yet approved or cleared by the [place] FDA and has been authorized for detection and/or diagnosis of SARS-CoV-2 by FDA under an Emergency Use Authorization (EUA). This EUA will remain in effect (meaning this test can be used) for the duration of the COVID-19 declaration under Section 564(b)(1) of the Act, 21 U.S.C. section 360bbb-3(b)(1), unless the authorization is terminated or revoked.  Performed at [facility], [address] [place], [place] [address]   Blood Culture (routine x 2)     Status: None (Preliminary result)   Collection Time: 06/22/23 10:23 AM   Specimen: BLOOD  Result Value Ref Range Status   Specimen Description BLOOD RIGHT ARM  Final   Special Requests   Final    BOTTLES DRAWN AEROBIC AND ANAEROBIC Blood Culture results may not be optimal due to an inadequate volume of blood received in culture bottles   Culture   Final  NO GROWTH 4 DAYS Performed at [facility], [address] [place], [place] [address]    Report Status PENDING  Incomplete  Blood Culture (routine x 2)     Status: None (Preliminary result)   Collection Time: 06/22/23 10:23 AM   Specimen: BLOOD  Result Value Ref Range Status   Specimen Description BLOOD BLOOD RIGHT ARM  Final   Special Requests   Final    BOTTLES DRAWN AEROBIC AND ANAEROBIC Blood Culture results may not be optimal due to an inadequate volume of blood received in culture bottles   Culture   Final    NO GROWTH 4 DAYS Performed at [facility],  [address], [place], [place] [address]    Report Status PENDING  Incomplete     Total time spend on discharging this patient, including the last patient exam, discussing the hospital stay, instructions for ongoing care as it relates to all pertinent caregivers, as well as preparing the medical discharge records, prescriptions, and/or referrals as applicable, is 35 minutes.    Darlin Priestly, MD  [facility] 06/26/2023, 10:00 AM

## 2023-06-27 ENCOUNTER — Encounter: Payer: Self-pay | Admitting: Gastroenterology

## 2023-06-27 LAB — CULTURE, BLOOD (ROUTINE X 2)
Culture: NO GROWTH
Culture: NO GROWTH

## 2023-11-03 ENCOUNTER — Other Ambulatory Visit: Payer: Self-pay | Admitting: Internal Medicine

## 2023-11-03 DIAGNOSIS — Z1231 Encounter for screening mammogram for malignant neoplasm of breast: Secondary | ICD-10-CM

## 2024-03-27 ENCOUNTER — Ambulatory Visit
Admission: RE | Admit: 2024-03-27 | Discharge: 2024-03-27 | Disposition: A | Discharge status: Home / Self Care | Source: Ambulatory Visit | Attending: Internal Medicine | Admitting: Internal Medicine

## 2024-03-27 DIAGNOSIS — Z1231 Encounter for screening mammogram for malignant neoplasm of breast: Secondary | ICD-10-CM | POA: Diagnosis present

## 2024-09-01 ENCOUNTER — Other Ambulatory Visit: Payer: Self-pay

## 2024-09-01 ENCOUNTER — Emergency Department

## 2024-09-01 ENCOUNTER — Emergency Department
Admission: EM | Admit: 2024-09-01 | Discharge: 2024-09-01 | Disposition: A | Discharge status: Home / Self Care | Attending: Emergency Medicine | Admitting: Emergency Medicine

## 2024-09-01 DIAGNOSIS — S82839A Other fracture of upper and lower end of unspecified fibula, initial encounter for closed fracture: Secondary | ICD-10-CM

## 2024-09-01 DIAGNOSIS — S82392A Other fracture of lower end of left tibia, initial encounter for closed fracture: Secondary | ICD-10-CM

## 2024-09-01 DIAGNOSIS — S8252XA Displaced fracture of medial malleolus of left tibia, initial encounter for closed fracture: Secondary | ICD-10-CM

## 2024-09-01 DIAGNOSIS — S22089A Unspecified fracture of T11-T12 vertebra, initial encounter for closed fracture: Secondary | ICD-10-CM

## 2024-09-01 MED ORDER — IBUPROFEN 600 MG PO TABS
600.0000 mg | ORAL_TABLET | Freq: Once | ORAL | Status: DC
  Filled 2024-09-01: qty 1

## 2024-09-01 MED ORDER — ACETAMINOPHEN 500 MG PO TABS
1000.0000 mg | ORAL_TABLET | Freq: Once | ORAL | Status: AC
  Administered 2024-09-01: 1000 mg via ORAL
  Filled 2024-09-01: qty 2

## 2024-09-01 NOTE — ED Triage Notes (Signed)
 Pt comes via EMS from home with fall. Pt states no loc or hitting head. Pt sates left ankle pain.

## 2024-09-01 NOTE — Discharge Instructions (Addendum)
 CT Lumbar Spine IMPRESSION: 1. Superior endplate fracture at T12 with 20% anterior height loss, worse on the left, without retropulsed bone. 2. Rightward curvature of the lumbar spine at L1-L2. 3. No fracture at L1 .  CT Left Ankle IMPRESSION: 1. Fractures of the distal fibula at the level of the ankle mortise and the distal lateral malleolar tip. 2. Mildly displaced fracture of the medial malleolus and nondisplaced fracture of the posterior malleolus of the tibia.  Please read through the included information about splint care (keep it clean and dry).  If your pain becomes much more severe, the splint becomes too tight, or you feel as if your injured limb is becoming numb or cold, please return immediately to the Emergency Department. You may take Tylenol  1000 mg every 6 hours for pain.  Follow up with the podiatry specialist (Dr. Lennie) listed in this paperwork. Give his office a call. When possible, keep your splint elevated to help with the swelling.  You may also use ice packs over the splint. Do not bear weight on the ankle until you speak to podiatry.  Use the walker at all times.  Also follow-up with your primary care provider and they can refer you to neurology if needed for the T12 back fracture. If you have a back brace at home, you may use it for stability/support. Return to the ER for any numbness or tingling of your legs, inability to walk, change in your walking/gait pattern with foot drag or any other new, worsening or concerning symptoms.

## 2024-09-01 NOTE — ED Provider Notes (Signed)
 "  [facility] Provider Note    Event Date/Time   First MD Initiated Contact with Patient 09/01/24 1544     (approximate)   History   Fall   HPI  [name] is a 84 y.o. female  with a past medical history of HTN, COPD, osteoporosis, asthma, vitamin D deficiency presents to the emergency department with fall that occurred today while she was outside on her porch.  She reports she was wearing her snow boots outside walking on her porch when she slipped in the snow/ice and hit her left ankle and lower back on the ground. She denies hitting her head, loss of consciousness, weakness or dizziness, chest pain or shortness of breath or abdominal pain. Denies numbness or tingling in lower extremities. Daughter is present with her and helping provide history.   Physical Exam   Triage Vital Signs: ED Triage Vitals  Encounter Vitals Group     BP 09/01/24 1309 (!) 158/69     Girls Systolic BP Percentile --      Girls Diastolic BP Percentile --      Boys Systolic BP Percentile --      Boys Diastolic BP Percentile --      Pulse Rate 09/01/24 1309 80     Resp 09/01/24 1309 20     Temp 09/01/24 1309 98.4 F (36.9 C)     Temp src --      SpO2 09/01/24 1309 95 %     Weight --      Height --      Head Circumference --      Peak Flow --      Pain Score 09/01/24 1307 6     Pain Loc --      Pain Education --      Exclude from Growth Chart --     Most recent vital signs: Vitals:   09/01/24 1930 09/01/24 2030  BP: 133/62 (!) 148/61  Pulse: 77 76  Resp:    Temp:    SpO2: 95% 95%    General: Awake, in no acute distress. Appears stated age. Head: Normocephalic, atraumatic. Eyes: PERRLA. EOMs intact.  Neck: Supple, no midline cervical tenderness. CV: Good peripheral perfusion. DP & PT pulses 2+ b/l. RRR, 76 bpm. Respiratory:Normal respiratory effort.  No respiratory distress. CTAB. GI: Soft, non-distended, non-tender.  MSK: Normal ROM and 5/5 strength in  b/l upper, right lower extremity. Limited exam of left ankle due to pain. Able to curl toes of left foot. Mild TTP of lower thoracic/upper lumbar spinal region. Skin:Warm, dry, intact. Swelling noted diffusely along left ankle medially and laterally.  Neurological: A&Ox4 to person, place, time, and situation. Cranial nerves II-XII grossly intact. Sensation intact and equal to b/l lower extremities.  GCS 15.   ED Results / Procedures / Treatments   Labs (all labs ordered are listed, but only abnormal results are displayed) Labs Reviewed - No data to display   EKG    RADIOLOGY X Rays Left ankle, lumbar spine  CT left ankle IMPRESSION: 1. Fractures of the distal fibula at the level of the ankle mortise and the distal lateral malleolar tip. 2. Mildly displaced fracture of the medial malleolus and nondisplaced fracture of the posterior malleolus of the tibia.  CT Lumbar spine Narrative & Impression  EXAM: CT OF THE LUMBAR SPINE WITHOUT CONTRAST 09/01/2024 06:02:00 PM   TECHNIQUE: CT of the lumbar spine was performed without the administration of intravenous contrast. Multiplanar reformatted  images are provided for review. Automated exposure control, iterative reconstruction, and/or weight based adjustment of the mA/kV was utilized to reduce the radiation dose to as low as reasonably achievable.   COMPARISON: Lumbar spine radiographs 09/01/2024.   CLINICAL HISTORY: Back trauma, questionable fracture deformity at L1. Fall with back pain. Abnormal lumbar spine radiographs.   FINDINGS:   BONES AND ALIGNMENT: Normal vertebral body heights  L1-L5.   Superior endplate fracture is present at T12 with 20% loss of height anteriorly. No retropulsed bone is present. The fracture is worse on the left. L1 vertebral body is intact. Rightward curvature of the lumbar spine is again seen at L1-L2. No suspicious bone lesion.   DEGENERATIVE CHANGES: No significant degenerative  changes.   SOFT TISSUES: The kidneys are somewhat atrophic bilaterally. Atherosclerotic changes are present in the aorta and branch vessels without aneurysm. No acute abnormality.   IMPRESSION: 1. Superior endplate fracture at T12 with 20% anterior height loss, worse on the left, without retropulsed bone. 2. Rightward curvature of the lumbar spine at L1-L2. 3. No fracture at L1 .     PROCEDURES:  Critical Care performed: No   Procedures   MEDICATIONS ORDERED IN ED: Medications  acetaminophen  (TYLENOL ) tablet 1,000 mg (1,000 mg Oral Given 09/01/24 1826)     IMPRESSION / MDM / ASSESSMENT AND PLAN / ED COURSE  I reviewed the triage vital signs and the nursing notes.                              Differential diagnosis includes, but is not limited to, distal left fibula fracture, tibial fracture, T12 fracture  Patient's presentation is most consistent with acute complicated illness / injury requiring diagnostic workup.  Patient is an 84 year old female presenting with signs and symptoms as described above.  She endorses a mechanical fall today after slipping in the ice/snow.  She was able to ambulate back into her home following the fall to call her daughter for assistance.  Daughter is present with her today on my evaluation.  Patient denies any medical complaints other than pain of her left ankle and back. Did not hit her head. Patient reports she normally ambulates without any assistance. CT lumbar spine with superior endplate fracture at T12, no bony retropulsion. Discussed with supervising physician, Dr. Arlander regarding possible back brace and he stated no need at this time. Will do PCP follow up for this finding, they can refer to neuro if needed.  X-ray of left ankle shows distal fibula and tibia fracture.  She is neurovascularly intact. Discussed with podiatry on-call Dr. Lennie and he recommended CT prior to discharge.  Patient's daughter states she will be of assistance in  the patient's home and will go home with walker.  Dr. Lennie suggested given the degree of her swelling, place in compressive leg splint, nonweightbearing. He will discuss conservative vs surgical management options outpatient on follow up. Given her age, will recommend Tylenol  for pain.  The patient may return to the emergency department for any new, worsening, or concerning symptoms. Patient was given the opportunity to ask questions; all questions were answered. Emergency department return precautions were discussed with the patient.  Patient is in agreement to the treatment plan.  Patient is stable for discharge.   FINAL CLINICAL IMPRESSION(S) / ED DIAGNOSES   Final diagnoses:  Closed fracture of twelfth thoracic vertebra, unspecified fracture morphology, initial encounter (HCC)  Closed fracture of distal end  of fibula, unspecified fracture morphology, initial encounter  Displaced fracture of medial malleolus of left tibia, initial encounter for closed fracture  Closed fracture of posterior malleolus of left tibia, initial encounter     Rx / DC Orders   ED Discharge Orders     None        Note:  This document was prepared using Dragon voice recognition software and may include unintentional dictation errors.     Sheron Yoder, [place] 09/02/24 2015  "
